# Patient Record
Sex: Male | Born: 1935 | Race: White | Hispanic: No | Marital: Married | Smoking: Former smoker
Health system: Southern US, Community
[De-identification: ages and names within clinical notes are randomized; demographics above are authoritative.]

## PROBLEM LIST (undated history)

## (undated) DIAGNOSIS — M1990 Unspecified osteoarthritis, unspecified site: Secondary | ICD-10-CM

## (undated) DIAGNOSIS — E785 Hyperlipidemia, unspecified: Secondary | ICD-10-CM

## (undated) DIAGNOSIS — I509 Heart failure, unspecified: Secondary | ICD-10-CM

## (undated) DIAGNOSIS — I739 Peripheral vascular disease, unspecified: Secondary | ICD-10-CM

## (undated) DIAGNOSIS — N400 Enlarged prostate without lower urinary tract symptoms: Secondary | ICD-10-CM

## (undated) DIAGNOSIS — Z7901 Long term (current) use of anticoagulants: Secondary | ICD-10-CM

## (undated) DIAGNOSIS — I255 Ischemic cardiomyopathy: Secondary | ICD-10-CM

## (undated) DIAGNOSIS — I469 Cardiac arrest, cause unspecified: Secondary | ICD-10-CM

## (undated) DIAGNOSIS — I10 Essential (primary) hypertension: Secondary | ICD-10-CM

## (undated) DIAGNOSIS — Z87442 Personal history of urinary calculi: Secondary | ICD-10-CM

## (undated) DIAGNOSIS — I480 Paroxysmal atrial fibrillation: Secondary | ICD-10-CM

## (undated) DIAGNOSIS — I779 Disorder of arteries and arterioles, unspecified: Secondary | ICD-10-CM

## (undated) DIAGNOSIS — E119 Type 2 diabetes mellitus without complications: Secondary | ICD-10-CM

## (undated) DIAGNOSIS — Z95810 Presence of automatic (implantable) cardiac defibrillator: Secondary | ICD-10-CM

## (undated) HISTORY — DX: Cardiac arrest, cause unspecified: I46.9

## (undated) HISTORY — DX: Hyperlipidemia, unspecified: E78.5

## (undated) HISTORY — DX: Ischemic cardiomyopathy: I25.5

## (undated) HISTORY — DX: Type 2 diabetes mellitus without complications: E11.9

## (undated) HISTORY — PX: FETAL SURGERY FOR CONGENITAL HERNIA: SHX1618

## (undated) HISTORY — DX: Personal history of urinary calculi: Z87.442

## (undated) HISTORY — DX: Paroxysmal atrial fibrillation: I48.0

## (undated) HISTORY — DX: Unspecified osteoarthritis, unspecified site: M19.90

## (undated) HISTORY — DX: Benign prostatic hyperplasia without lower urinary tract symptoms: N40.0

## (undated) HISTORY — DX: Disorder of arteries and arterioles, unspecified: I77.9

## (undated) HISTORY — DX: Essential (primary) hypertension: I10

## (undated) HISTORY — DX: Long term (current) use of anticoagulants: Z79.01

## (undated) HISTORY — DX: Peripheral vascular disease, unspecified: I73.9

## (undated) HISTORY — PX: CARDIAC DEFIBRILLATOR PLACEMENT: SHX171

## (undated) HISTORY — PX: OTHER SURGICAL HISTORY: SHX169

## (undated) HISTORY — DX: Presence of automatic (implantable) cardiac defibrillator: Z95.810

---

## 1999-07-01 HISTORY — PX: CORONARY ARTERY BYPASS GRAFT: SHX141

## 2000-02-24 ENCOUNTER — Inpatient Hospital Stay (HOSPITAL_COMMUNITY): Admission: EM | Admit: 2000-02-24 | Discharge: 2000-03-07 | Payer: Self-pay | Admitting: Cardiology

## 2000-02-24 DIAGNOSIS — I469 Cardiac arrest, cause unspecified: Secondary | ICD-10-CM

## 2000-02-24 HISTORY — DX: Cardiac arrest, cause unspecified: I46.9

## 2000-02-26 ENCOUNTER — Encounter: Payer: Self-pay | Admitting: Cardiology

## 2000-02-27 ENCOUNTER — Encounter: Payer: Self-pay | Admitting: Cardiology

## 2000-02-28 ENCOUNTER — Encounter: Payer: Self-pay | Admitting: Cardiothoracic Surgery

## 2000-02-28 DIAGNOSIS — I255 Ischemic cardiomyopathy: Secondary | ICD-10-CM

## 2000-02-28 HISTORY — DX: Ischemic cardiomyopathy: I25.5

## 2000-02-29 ENCOUNTER — Encounter: Payer: Self-pay | Admitting: Thoracic Surgery (Cardiothoracic Vascular Surgery)

## 2000-02-29 ENCOUNTER — Encounter: Payer: Self-pay | Admitting: Cardiothoracic Surgery

## 2000-03-01 ENCOUNTER — Encounter: Payer: Self-pay | Admitting: Cardiothoracic Surgery

## 2000-03-02 ENCOUNTER — Encounter: Payer: Self-pay | Admitting: Cardiothoracic Surgery

## 2000-03-02 ENCOUNTER — Encounter: Payer: Self-pay | Admitting: Thoracic Surgery (Cardiothoracic Vascular Surgery)

## 2001-01-05 ENCOUNTER — Ambulatory Visit (HOSPITAL_COMMUNITY): Admission: RE | Admit: 2001-01-05 | Discharge: 2001-01-05 | Payer: Self-pay | Admitting: Gastroenterology

## 2001-01-05 ENCOUNTER — Encounter (INDEPENDENT_AMBULATORY_CARE_PROVIDER_SITE_OTHER): Payer: Self-pay | Admitting: Specialist

## 2001-09-28 ENCOUNTER — Encounter: Payer: Self-pay | Admitting: Vascular Surgery

## 2001-09-29 ENCOUNTER — Inpatient Hospital Stay (HOSPITAL_COMMUNITY): Admission: RE | Admit: 2001-09-29 | Discharge: 2001-09-30 | Payer: Self-pay | Admitting: Vascular Surgery

## 2001-09-29 ENCOUNTER — Encounter (INDEPENDENT_AMBULATORY_CARE_PROVIDER_SITE_OTHER): Payer: Self-pay | Admitting: Specialist

## 2002-10-24 ENCOUNTER — Encounter: Admission: RE | Admit: 2002-10-24 | Discharge: 2003-01-22 | Payer: Self-pay | Admitting: Internal Medicine

## 2002-12-24 ENCOUNTER — Encounter: Payer: Self-pay | Admitting: Cardiology

## 2002-12-24 ENCOUNTER — Ambulatory Visit (HOSPITAL_COMMUNITY): Admission: RE | Admit: 2002-12-24 | Discharge: 2002-12-24 | Payer: Self-pay | Admitting: Cardiology

## 2008-09-14 ENCOUNTER — Ambulatory Visit: Payer: Self-pay | Admitting: Surgery

## 2009-10-28 HISTORY — PX: TRANSURETHRAL RESECTION OF PROSTATE: SHX73

## 2009-11-20 ENCOUNTER — Ambulatory Visit (HOSPITAL_COMMUNITY): Admission: RE | Admit: 2009-11-20 | Discharge: 2009-11-21 | Payer: Self-pay | Admitting: Urology

## 2009-11-20 ENCOUNTER — Encounter (INDEPENDENT_AMBULATORY_CARE_PROVIDER_SITE_OTHER): Payer: Self-pay | Admitting: Urology

## 2010-02-20 ENCOUNTER — Ambulatory Visit (HOSPITAL_COMMUNITY): Admission: RE | Admit: 2010-02-20 | Discharge: 2010-02-20 | Payer: Self-pay | Admitting: Cardiology

## 2010-02-20 ENCOUNTER — Ambulatory Visit: Payer: Self-pay | Admitting: Cardiology

## 2010-02-20 ENCOUNTER — Encounter: Payer: Self-pay | Admitting: Internal Medicine

## 2010-02-25 ENCOUNTER — Ambulatory Visit: Payer: Self-pay | Admitting: Cardiology

## 2010-02-28 HISTORY — PX: CARDIAC CATHETERIZATION: SHX172

## 2010-03-05 ENCOUNTER — Encounter: Payer: Self-pay | Admitting: Internal Medicine

## 2010-03-05 ENCOUNTER — Ambulatory Visit: Payer: Self-pay | Admitting: Cardiology

## 2010-03-05 ENCOUNTER — Encounter: Admission: RE | Admit: 2010-03-05 | Discharge: 2010-03-05 | Payer: Self-pay | Admitting: Cardiology

## 2010-03-13 ENCOUNTER — Encounter: Payer: Self-pay | Admitting: Internal Medicine

## 2010-03-13 ENCOUNTER — Ambulatory Visit: Payer: Self-pay | Admitting: Cardiology

## 2010-03-21 ENCOUNTER — Encounter: Payer: Self-pay | Admitting: Internal Medicine

## 2010-03-21 ENCOUNTER — Ambulatory Visit (HOSPITAL_COMMUNITY): Admission: RE | Admit: 2010-03-21 | Discharge: 2010-03-22 | Payer: Self-pay | Admitting: Cardiology

## 2010-03-21 ENCOUNTER — Ambulatory Visit: Payer: Self-pay | Admitting: Cardiology

## 2010-04-02 ENCOUNTER — Ambulatory Visit: Payer: Self-pay | Admitting: Cardiology

## 2010-04-18 ENCOUNTER — Ambulatory Visit: Payer: Self-pay | Admitting: Internal Medicine

## 2010-04-18 DIAGNOSIS — R0989 Other specified symptoms and signs involving the circulatory and respiratory systems: Secondary | ICD-10-CM

## 2010-04-18 DIAGNOSIS — R0609 Other forms of dyspnea: Secondary | ICD-10-CM

## 2010-04-18 DIAGNOSIS — I4891 Unspecified atrial fibrillation: Secondary | ICD-10-CM

## 2010-04-18 DIAGNOSIS — I5022 Chronic systolic (congestive) heart failure: Secondary | ICD-10-CM

## 2010-04-19 ENCOUNTER — Encounter (INDEPENDENT_AMBULATORY_CARE_PROVIDER_SITE_OTHER): Payer: Self-pay | Admitting: *Deleted

## 2010-04-22 ENCOUNTER — Telehealth (INDEPENDENT_AMBULATORY_CARE_PROVIDER_SITE_OTHER): Payer: Self-pay | Admitting: *Deleted

## 2010-04-22 ENCOUNTER — Ambulatory Visit: Payer: Self-pay | Admitting: Internal Medicine

## 2010-04-29 ENCOUNTER — Ambulatory Visit: Payer: Self-pay | Admitting: Cardiology

## 2010-04-30 ENCOUNTER — Ambulatory Visit: Payer: Self-pay | Admitting: Cardiology

## 2010-04-30 ENCOUNTER — Inpatient Hospital Stay (HOSPITAL_COMMUNITY): Admission: AD | Admit: 2010-04-30 | Discharge: 2010-05-03 | Payer: Self-pay | Admitting: Internal Medicine

## 2010-04-30 DIAGNOSIS — Z95810 Presence of automatic (implantable) cardiac defibrillator: Secondary | ICD-10-CM

## 2010-04-30 HISTORY — DX: Presence of automatic (implantable) cardiac defibrillator: Z95.810

## 2010-05-01 ENCOUNTER — Encounter: Payer: Self-pay | Admitting: Internal Medicine

## 2010-05-02 ENCOUNTER — Telehealth: Payer: Self-pay | Admitting: Internal Medicine

## 2010-05-03 ENCOUNTER — Telehealth: Payer: Self-pay | Admitting: Internal Medicine

## 2010-05-06 ENCOUNTER — Encounter: Payer: Self-pay | Admitting: Internal Medicine

## 2010-05-08 ENCOUNTER — Inpatient Hospital Stay (HOSPITAL_COMMUNITY): Admission: RE | Admit: 2010-05-08 | Discharge: 2010-05-10 | Payer: Self-pay | Admitting: Internal Medicine

## 2010-05-08 ENCOUNTER — Ambulatory Visit: Payer: Self-pay | Admitting: Internal Medicine

## 2010-05-16 ENCOUNTER — Ambulatory Visit: Payer: Self-pay

## 2010-05-21 ENCOUNTER — Ambulatory Visit: Payer: Self-pay | Admitting: Cardiology

## 2010-05-27 ENCOUNTER — Ambulatory Visit: Payer: Self-pay | Admitting: Cardiology

## 2010-05-27 ENCOUNTER — Encounter: Payer: Self-pay | Admitting: Internal Medicine

## 2010-06-03 ENCOUNTER — Ambulatory Visit: Payer: Self-pay | Admitting: Cardiology

## 2010-06-10 ENCOUNTER — Ambulatory Visit: Payer: Self-pay | Admitting: Cardiology

## 2010-06-26 ENCOUNTER — Ambulatory Visit: Payer: Self-pay | Admitting: Cardiology

## 2010-07-02 ENCOUNTER — Inpatient Hospital Stay (HOSPITAL_COMMUNITY)
Admission: EM | Admit: 2010-07-02 | Discharge: 2010-07-07 | Payer: Self-pay | Source: Home / Self Care | Attending: Cardiology | Admitting: Cardiology

## 2010-07-03 LAB — CARDIAC PANEL(CRET KIN+CKTOT+MB+TROPI)
CK, MB: 1.1 ng/mL (ref 0.3–4.0)
CK, MB: 0.8 ng/mL (ref 0.3–4.0)
Relative Index: INVALID (ref 0.0–2.5)
Relative Index: INVALID (ref 0.0–2.5)
Total CK: 46 U/L (ref 7–232)
Total CK: 40 U/L (ref 7–232)
Troponin I: 0.03 ng/mL (ref 0.00–0.06)
Troponin I: 0.03 ng/mL (ref 0.00–0.06)

## 2010-07-03 LAB — COMPREHENSIVE METABOLIC PANEL
ALT: 20 U/L (ref 0–53)
AST: 26 U/L (ref 0–37)
Albumin: 3.4 g/dL — ABNORMAL LOW (ref 3.5–5.2)
Alkaline Phosphatase: 152 U/L — ABNORMAL HIGH (ref 39–117)
BUN: 11 mg/dL (ref 6–23)
CO2: 33 mEq/L — ABNORMAL HIGH (ref 19–32)
Calcium: 8.7 mg/dL (ref 8.4–10.5)
Chloride: 102 mEq/L (ref 96–112)
Creatinine, Ser: 0.84 mg/dL (ref 0.4–1.5)
GFR calc Af Amer: >60 mL/min (ref >60)
GFR calc non Af Amer: >60 mL/min (ref >60)
Glucose, Bld: 97 mg/dL (ref 70–99)
Potassium: 3.6 mEq/L (ref 3.5–5.1)
Sodium: 140 mEq/L (ref 135–145)
Total Bilirubin: 1.2 mg/dL (ref 0.3–1.2)
Total Protein: 6.8 g/dL (ref 6.0–8.3)

## 2010-07-03 LAB — GLUCOSE, CAPILLARY
Glucose-Capillary: 115 mg/dL — ABNORMAL HIGH (ref 70–99)
Glucose-Capillary: 161 mg/dL — ABNORMAL HIGH (ref 70–99)
Glucose-Capillary: 120 mg/dL — ABNORMAL HIGH (ref 70–99)

## 2010-07-03 LAB — PROTIME-INR
INR: 1.77 — ABNORMAL HIGH (ref 0.00–1.49)
Prothrombin Time: 20.8 seconds — ABNORMAL HIGH (ref 11.6–15.2)

## 2010-07-03 LAB — MAGNESIUM: Magnesium: 2.6 mg/dL — ABNORMAL HIGH (ref 1.5–2.5)

## 2010-07-04 LAB — PROTIME-INR
INR: 2.14 — ABNORMAL HIGH (ref 0.00–1.49)
Prothrombin Time: 24.1 seconds — ABNORMAL HIGH (ref 11.6–15.2)

## 2010-07-04 LAB — GLUCOSE, CAPILLARY: Glucose-Capillary: 150 mg/dL — ABNORMAL HIGH (ref 70–99)

## 2010-07-05 LAB — BASIC METABOLIC PANEL
BUN: 13 mg/dL (ref 6–23)
CO2: 29 mEq/L (ref 19–32)
Calcium: 9.2 mg/dL (ref 8.4–10.5)
Chloride: 101 mEq/L (ref 96–112)
Creatinine, Ser: 0.94 mg/dL (ref 0.4–1.5)
GFR calc Af Amer: >60 mL/min (ref >60)
GFR calc non Af Amer: >60 mL/min (ref >60)
Glucose, Bld: 100 mg/dL — ABNORMAL HIGH (ref 70–99)
Potassium: 4.6 mEq/L (ref 3.5–5.1)
Sodium: 138 mEq/L (ref 135–145)

## 2010-07-05 LAB — CBC
HCT: 39.7 % (ref 39.0–52.0)
Hemoglobin: 13.1 g/dL (ref 13.0–17.0)
MCH: 30 pg (ref 26.0–34.0)
MCHC: 33 g/dL (ref 30.0–36.0)
MCV: 90.8 fL (ref 78.0–100.0)
Platelets: 183 10*3/uL (ref 150–400)
RBC: 4.37 MIL/uL (ref 4.22–5.81)
RDW: 15.1 % (ref 11.5–15.5)
WBC: 8.4 10*3/uL (ref 4.0–10.5)

## 2010-07-05 LAB — PROTIME-INR
INR: 2.02 — ABNORMAL HIGH (ref 0.00–1.49)
Prothrombin Time: 23 seconds — ABNORMAL HIGH (ref 11.6–15.2)

## 2010-07-11 ENCOUNTER — Encounter: Payer: Self-pay | Admitting: Internal Medicine

## 2010-07-15 LAB — PROTIME-INR
INR: 1.73 — ABNORMAL HIGH (ref 0.00–1.49)
INR: 1.83 — ABNORMAL HIGH (ref 0.00–1.49)
Prothrombin Time: 20.4 seconds — ABNORMAL HIGH (ref 11.6–15.2)
Prothrombin Time: 21.3 seconds — ABNORMAL HIGH (ref 11.6–15.2)

## 2010-07-15 LAB — GLUCOSE, CAPILLARY
Glucose-Capillary: 202 mg/dL — ABNORMAL HIGH (ref 70–99)
Glucose-Capillary: 210 mg/dL — ABNORMAL HIGH (ref 70–99)

## 2010-07-18 ENCOUNTER — Ambulatory Visit: Admission: RE | Admit: 2010-07-18 | Discharge: 2010-07-18 | Payer: Self-pay | Source: Home / Self Care

## 2010-07-18 ENCOUNTER — Encounter: Payer: Self-pay | Admitting: Internal Medicine

## 2010-07-18 NOTE — Op Note (Signed)
Ian Williams, Ian Williams              ACCOUNT NO.:  [id number]  MEDICAL RECORD NO.:  [id number]          PATIENT TYPE:  INP  LOCATION:  2039                         FACILITY:  [facility]  PHYSICIAN:  Duke Salvia, MD, FACCDATE OF BIRTH:  1931-02-25  DATE OF PROCEDURE: DATE OF DISCHARGE:                              OPERATIVE REPORT   PREOPERATIVE DIAGNOSES:  Status post ICD with post implant ventricular tachycardia - polymorphic; reversion to sinus rhythm thought to have had permanent atrial fibrillation.  POSTOPERATIVE DIAGNOSES:  Status post ICD with post implant ventricular tachycardia - polymorphic; reversion to sinus rhythm thought to have had permanent atrial fibrillation.  PROCEDURE:  Repositioning of the defibrillator lead, insertion of the right atrial lead.  Contrast venogram demonstrated patency of the vein prior to procedure initiation and pocket revision to allow for housing of the defibrillator, the larger defibrillator with the application of Aegis pouch.  Following obtaining informed consent, the patient was brought to the electrophysiology laboratory and placed on the fluoroscopic table in supine position.  After routine prep and drape, lidocaine was infiltrated overlying the previous incision and carried down to layer of device pocket using sharp dissection electrocautery.  The pocket was opened and device was explanted.  The right ventricular defibrillator lead was freed up.  The screw was retracted and the lead was torqued counterclockwise with easy separation from the myocardium.  The patient's lead was then repositioned on a somewhat distal portion of the septum instead of the apex.  In this location, the bipolar R-wave was 15.6 with a pace impedance of 602 with threshold of 0.7 and 0.5, the current of 5 volts with 0.8 MA.  There is no diaphragmatic pacing at 10 volts and the current of injury was brisk.  We then gained access to the extrathoracic left  subclavian vein and placed an 7-French sheath which was then passed through a Medtronic 5076, 52-cm lead, serial number [id number].  With great difficulty and multiple manipulations, this lead was finally secured to the floor of the right atrial appendage where bipolar P-wave was 3.3 with a pace impedance of 712 with threshold of 0.9 and 0.5, current of 5 volts at 1.2 MA.  There is no diaphragmatic pacing at 10 volts and the current of injury was brisk.  These leads were then secured to the prepectoral fascia.  The device previously explanted was removed.  A new Medtronic [id number], serial [id number].  A CRT device was implanted with the LV port plugged.  Through the device, bipolar P-wave was 3 with pace impedance of 437, threshold of  0.75 and 0.4.  The R-wave was 16.8 with pacing of 437, threshold of 0.75 and 0.4.  High-voltage impedances were 43/46.  Defibrillation threshold testing was not undertaken.  The pocket was copiously irrigated with antibiotic containing saline solution. Hemostasis was assured.  The leads and pulse generator were placed in the pocket and placed in Aegis pouch.  Surgicel was used at the cephalad end of the pocket which had to be extended because of the larger head.  The wound was then closed in 3 layers in normal fashion.  A benzoin and  Steri-Strip dressing was applied.  Needle counts, sponge counts, and instrument counts were correct at the end of procedure according to staff.  The patient tolerated the procedure without apparent complication.     Duke Salvia, MD, [facility]     SCK/MEDQ  D:  07/05/2010  T:  07/06/2010  Job:  [id number]  Electronically Signed by Sherryl Manges MD [facility] on 07/18/2010 01:40:38 PM

## 2010-07-19 ENCOUNTER — Ambulatory Visit: Payer: Self-pay | Admitting: Cardiology

## 2010-07-24 ENCOUNTER — Ambulatory Visit: Payer: Self-pay | Admitting: Cardiology

## 2010-07-25 NOTE — Discharge Summary (Addendum)
Ian Williams, Ian Williams              ACCOUNT NO.:  [id number]  MEDICAL RECORD NO.:  [id number]          PATIENT TYPE:  INP  LOCATION:  2039                         FACILITY:  [facility]  PHYSICIAN:  Rollene Rotunda, MD, FACCDATE OF BIRTH:  1930/07/17  DATE OF ADMISSION:  07/02/2010 DATE OF DISCHARGE:  07/07/2010                              DISCHARGE SUMMARY   PRIMARY CARDIOLOGIST:  Colleen Can. Deborah Chalk, MD  ELECTROPHYSIOLOGIST:  Duke Salvia, MD, [facility]  PRIMARY CARE PHYSICIAN:  Redge Gainer. Perini, MD  DISCHARGE DIAGNOSES: 1. Status post ICD with post-implant ventricular tachycardia,     polymorphic.     a.     Status post repositioning of the defibrillator lead,      insertion of the right atrial lead, pocket revision on July 05, 2010. 2. Atrial fibrillation, reversion to sinus rhythm, although thought to     have permanent atrial fibrillation. 3. Ischemic cardiomyopathy, chronic systolic congestive heart failure,     ejection fraction 20% to 25%. 4. Permanent atrial fibrillation, reversion to sinus rhythm.  SECONDARY DIAGNOSES: 1. Coronary artery disease status post aortocoronary bypass surgery in     2001, left internal mammary artery to diagonal, saphenous vein     graft to obtuse marginal 1 and obtuse marginal 2, saphenous vein     graft to posterior descending artery. 2. Diabetes. 3. Hypertension. 4. Peripheral vascular disease. 5. Hyperlipidemia. 6. Gout. 7. Hypothyroidism.  DRUG ALLERGIES: 1. PENICILLIN causing rash. 2. SULFA. 3. ASPIRIN causing hives.  PROCEDURES PERFORMED DURING HOSPITALIZATION: 1. Repositioning of defibrillator lead, insertion of right atrial     lead.  Contrast venogram demonstrated patency of the vein prior to     procedure initiation and pocket revision to rule out for housing of     defibrillator. 2. Chest x-ray postprocedure showing no pneumothorax or other apparent     complications.  There is a left subclavian AICD revision noted.    a.     CT angiography of the next demonstrating ulcerated soft      plaque and calcified plaque in the right ICA origin resulting in      stenosis up to 60% with respect to distal vessel.  Left carotid      endarterectomy with cervical carotid stenosis.  Bilateral heavily      calcified ICA atherosclerosis, which results in hemodynamically      significant stenosis in the left ICA anterior genu.  Right      vertebral artery origin and proximal left subclavian artery      atherosclerotic 50% with respect to distal vessel.  Extensive      pulmonary emphysema mass-like opacity in the right lung apex      incompensating 11 x 29 x 25 mm.  Differential considerations      include confluent scarring versus neoplasm.  Echo and followup CT      in 3 months versus PET/CT. 3. CT of head without contrast demonstrating no evidence of acute     intracranial abnormalities.  HISTORY OF PRESENT ILLNESS:  This is a 77 year old gentleman with history of coronary artery  disease and ICD insertion for secondary prevention.  The patient presented to the emergency department after a syncopal episode while eating breakfast.  This was witnessed by his wife.  The patient fell off his chair and wife noted some jerking.  The patient's loss of consciousness quickly resolved.  After his wife insistence, he presented for further evaluation.  The patient's device was interrogated in the emergency department and showed multiple episodes of ventricular tachycardia and ventricular fibrillation treated with appropriate therapy.  The patient's EKG showed atrial fibrillation with ventricular pacing at a rate of 105 beats per minute.  The patient was admitted for further evaluation.  HOSPITAL COURSE:  With the patient having multiple episodes of polymorphic ventricular tachycardia and several shocks over the last 3-4 days, his device noted that events occur in lower rate of his device if pacing.  Of note, the patient did  have polymorphic ventricular tachycardia noted at the time of his ICD insertion.  Etiology of his polymorphic ventricular tachycardia was felt not to be ischemic.  The patient did rule out for myocardial infarction.  After interrogation, his baseline rate on his ICD was increased from 50-70 beats per minute. Dr. Graciela Husbands had evaluated the patient and noted that device seems to be the issue.  There are reports wherein lead extraction has been used to correct issue.  The patient was monitored and it was felt that the patient's ICD discharge was from electrode lead arrhythmia as there was no evidence of mechanical for arrhythmia.  Therefore, the patient was taken for device revision.  Risks, benefits, and indications were discussed with the patient and he voiced understanding and agreed to proceed.  Dr. Graciela Husbands brought the patient to the EP Lab where informed consent was obtained.  Dr. Graciela Husbands completed successful repositioning of the defibrillator lead, insertion of the right atrial lead.  There was also a contrast venogram demonstrating patency of the vein prior to procedure initiation and pocket revision to allow for housing of the defibrillator as this is a larger defibrillator.  A new Medtronic device was implanted.  The patient tolerated this procedure well.  On the day after separation, the patient had some neck pain.  This relieved prior to discharge.  The patient was noted to have no further arrhythmias on telemetry.  The patient's rhythm remained in sinus rhythm, although the patient was felt to have permanent atrial fibrillation that was reversion to a sinus rhythm and is now noted to have paroxysmal atrial fibrillation.  On day of discharge, Dr. Antoine Poche evaluated the patient and noted him stable for home.  His ICD site did show slight swelling.  There was a discussion with the patient to watch his site closely overnight.  He will be evaluated in the Device Clinic in 24 hours for wound  check.  At this time, the patient will also get an INR check as he was resumed on his Coumadin.  Amiodarone was added during admission to support maintaining sinus rhythm and the patient will be continued on this at discharge.  The patient will have close followup within 10 days with Dr. Deborah Chalk for reevaluation.  DISCHARGE LABS:  INR 1.83.  VITAL SIGNS:  Stable.  DISCHARGE MEDICATIONS: 1. Amiodarone 200 mg 2 tablets every 8 hours. 2. Allopurinol 300 mg daily. 3. Amlodipine 10 mg daily. 4. Clonidine 0.3 mg daily. 5. Coumadin 5 mg 1 to 1-1/2 tablets daily, 1 tablet Wednesdays,     Fridays, and Sundays; all other days 1-1/2 tablets. 6. Digoxin 0.125  mg daily. 7. Fish oil 1 g 1 capsule twice daily. 8. Furosemide 40 mg twice daily. 9. Levothyroxine 75 mcg daily. 10.Metoprolol tartrate 100 mg twice daily. 11.Multivitamin daily. 12.Niacin 500 mg daily. 13.Ocuvite 1 tablet twice daily. 14.Pepcid 20 mg daily. 15.Potassium chloride daily. 16.Ramipril 5 mg twice daily. 17.Tylenol oral solution 1-2 tablets daily as needed for pain. 18.Zetia 10 mg daily.  DISCHARGE PLAN AND INSTRUCTIONS: 1. The patient will follow up in the Device Clinic at [facility]     Cardiology on Monday; July 08, 2010.  At that time, the patient     will have INR drawn. 2. The patient will follow up with Dr. Deborah Chalk within 10 days, the     office will call and schedule this appointment. 3. The patient is to increase activity slowly.  No driving for 1 week. 4. The patient to see supplemental device discharge instructions for     wound care or mobility. 5. The patient is to continue low-sodium heart-healthy diet. 6. The patient is to call our office for any difficulties. 7. The patient will follow up with Dr. Graciela Husbands in 3 months, the office     will call to schedule this appointment.  Duration of discharge is greater than 30 minutes with physician and physician extender time.     Leonette Monarch,  PA-C   ______________________________ Rollene Rotunda, MD, [facility]    NB/MEDQ  D:  07/07/2010  T:  07/07/2010  Job:  [id number]  cc:   Colleen Can. Deborah Chalk, M.D. Duke Salvia, MD, [facility] Perini, M.D.  Electronically Signed by Alen Blew P.A. on 07/11/2010 03:33:20 PM Electronically Signed by Rollene Rotunda MD [facility] on 07/25/2010 12:23:32 PM

## 2010-07-30 NOTE — Cardiovascular Report (Signed)
Summary: Pre Op Orders   Pre Op Orders   Imported By: Roderic Ovens 05/01/2010 11:46:47  _____________________________________________________________________  External Attachment:    Type:   Image     Comment:   External Document

## 2010-07-30 NOTE — Consult Note (Signed)
Summary: [facility]  [facility]   Imported By: Roderic Ovens 05/13/2010 09:28:36  _____________________________________________________________________  External Attachment:    Type:   Image     Comment:   External Document

## 2010-07-30 NOTE — Progress Notes (Signed)
Summary: Nightwatch monitor  Phone Note Outgoing Call Call back at [facility] Phone [phone]   Call placed by: Stanton Kidney, EMT-P,  April 22, 2010 2:55 PM Call placed to: Patient Action Taken: Phone Call Completed Summary of Call: Pt confirmed info for enrollment of Nightwatch monitor. Stanton Kidney, EMT-P  April 22, 2010 2:55 PM

## 2010-07-30 NOTE — Progress Notes (Signed)
Summary: fyi :sleep test not done  Phone Note From Other Clinic   Caller: [facility] Summary of Call: calling to let us know home sleep test not done on 10-27 as scheduled, pt in hospital Initial call taken by: Glynda Jaeger,  May 02, 2010 9:39 AM  Follow-up for Phone Call        pt admited at [facility]. BNP 2992, TSH 13, Bili 1.8, ALK phos 158. ABD U/S pned, CE - x3, Tachy. Plan is for diuresis. Notes state that Dr. Ladona Ridgel may do ICD implant on Friday 11/5 instead of Dr. Graciela Husbands on 11/9, Follow-up by: Claris Gladden RN,  May 02, 2010 9:56 AM

## 2010-07-30 NOTE — Letter (Signed)
Summary: [facility]   Imported By: Marylou Mccoy 04/29/2010 12:56:15  _____________________________________________________________________  External Attachment:    Type:   Image     Comment:   External Document

## 2010-07-30 NOTE — Procedures (Signed)
Summary: [facility]./ appt is 12:15/ gd   Current Medications (verified): 1)  Norvasc 10 Mg Tabs (Amlodipine Besylate) .... Once Daily 2)  Zetia 10 Mg Tabs (Ezetimibe) .Marland Kitchen.. 1-2 Once Daily 3)  Catapres 0.3 Mg Tabs (Clonidine Hcl) .... Once Daily 4)  Ramipril 5 Mg Caps (Ramipril) .Marland Kitchen.. 1 Cap, Two Times A Day 5)  Zyloprim 300 Mg Tabs (Allopurinol) .... Once Daily 6)  Lopressor 100 Mg Tabs (Metoprolol Tartrate) .... Once Daily 7)  Niaspan 500 Mg Cr-Tabs (Niacin (Antihyperlipidemic)) .... Once Daily 8)  K-Lor 20 Meq Pack (Potassium Chloride) .... Take 2 Tablet By Mouth Once Daily 9)  Coumadin 5 Mg Tabs (Warfarin Sodium) .... As Directed 10)  Furosemide 40 Mg Tabs (Furosemide) .... Take 2 Tablet By Mouth Two Times A Day 11)  Fish Oil 1000 Mg Caps (Omega-3 Fatty Acids) .... Two Times A Day 12)  Tylenol 325 Mg Tabs (Acetaminophen) .... As Needed 13)  Ocuvite Preservision  Tabs (Multiple Vitamins-Minerals) .... Two Times A Day 14)  [employer] Spectravite Performance  Tabs (Multiple Vitamins-Minerals) .... Once Daily 15)  Digoxin 0.125 Mg Tabs (Digoxin) .... Take 1 Tablet By Mouth Once Daily 16)  Levothroid 75 Mcg Tabs (Levothyroxine Sodium) .... Take 1 Tablet By Mouth Once Daily 17)  Pepcid 20 Mg Tabs (Famotidine) .... Take 1 Tablet By Mouth Once Daily  Allergies (verified): 1)  ! Pcn 2)  ! Sulfa   ICD Specifications Following MD:  Sherryl Manges, MD     ICD Vendor:  Medtronic     ICD Model Number:  [id number]     ICD Serial Number:  [id number] ICD DOI:  05/08/2010     ICD Implanting MD:  Sherryl Manges, MD  Lead 1:    Location: RV     DOI: 05/08/2010     Model #: [id number]     Serial #: [id number]     Status: active  ICD Follow Up Battery Voltage:  3.21 V     Charge Time:  8.7 seconds     Underlying rhythm:  SR   ICD Device Measurements Right Ventricle:  Amplitude: 8.5 mV, Impedance: 437 ohms, Threshold: 1.00 V at 0.40 msec Shock Impedance: 42/49 ohms   Episodes MS Episodes:  0     Shock:  0     ATP:  0      Nonsustained:  0     Atrial Therapies:  0 Ventricular Pacing:  25.9%  Brady Parameters Mode VVI     Lower Rate Limit:  50      Tachy Zones VF:  200     VT:  200-231     VT1:  171-200     Next Cardiology Appt Due:  08/21/2010 Tech Comments:  WOUND CHECK--STERI STRIPS REMOVED.  NO REDNESS OR HEAT TO SITE.  SOME SWELLING AT SITE.  PER PT HAD COMPRESSION BANDAGE PUT ON AT HOSP.  PT STARTED BACK COUMADIN--FOLLOWED BY [facility] [facility]. PT TO HAVE CHECK ON 11-22. PT TO CALL IF NOTICES ANY CHANGES.  PT WILL HAVE SITE CHECKED ON 11-22.  NORMAL DEVICE FUNCTION.  CHANGED MAX LEAD IMPEDANCES FOR LIA. ROV 08-21-10 @ 915 W/SK. Vella Kohler  May 16, 2010 12:51 PM   Patient Instructions: 1)  Appointment with Dr Graciela Husbands scheduled for 08-21-10 @ 915am.

## 2010-07-30 NOTE — Cardiovascular Report (Signed)
Summary: [facility]  [facility]   Imported By: Marylou Mccoy 04/29/2010 12:14:21  _____________________________________________________________________  External Attachment:    Type:   Image     Comment:   External Document

## 2010-07-30 NOTE — Progress Notes (Addendum)
Summary: DOES PT HAVE T GET LABS DONE  Phone Note Call from Patient Call back at Home Phone [phone]   Caller: Spouse/ELIZABETH Reason for Call: Talk to Nurse Complaint: Chest Pain Summary of Call: PT WIFE ELIZABETH WOULD LIKE TO KNOW IF HE NEED TO RESCHEDULE HIS APPT FOR LAB. PT WAS IN THE HOSPITAL 10.2.11 AND DC 10.4.11.Marland KitchenLast PT :    IS SCHEDULE FOR A DEVICE ON 10.9.11.  Initial call taken by: Roe Coombs,  May 03, 2010 3:47 PM  Follow-up for Phone Call        pt's wife calling back from friday's message,didn't hear anything and would like to know asap re rs the lab work prior to pt's surgery-pls call -ok to leave msg on [phone] Glynda Jaeger  May 06, 2010 8:53 AM  Additional Follow-up for Phone Call Additional follow up Details #1::        s/w Mrs. Moravek and told her no need to come in for labs. printed off hospital lab.s  Additional Follow-up by: Claris Gladden RN,  May 06, 2010 9:19 AM     Appended Document: DOES PT HAVE T GET LABS DONE liver tests are abnormall  this may be related to heart failure or something  lets plan to redraw these when he shows up on wed t thanks  steve

## 2010-07-30 NOTE — Letter (Signed)
Summary: [facility]  [facility]   Imported By: Marylou Mccoy 04/29/2010 12:53:21  _____________________________________________________________________  External Attachment:    Type:   Image     Comment:   External Document

## 2010-07-30 NOTE — Assessment & Plan Note (Signed)
Summary: nep. discuss icd placement pt has medicare. gd   Visit Type:  new pt Referring Provider:  Dr. Roger Shelter  CC:  shortness of breath-Occ and .  History of Present Illness: Ian Williams is seen today at the request of Dr. Deborah Chalk for consideration of ICD implantation.  He had no other went to bypass surgery in 2001 after presenting with a heart attack and cardiac arrest. He did well. He has had progressive dysfunction however on his left ventricle with ejection fraction 2009 measured at 40% and more recently 20-25%. Because of the change he underwent catheterization at the end of September demonstrating patent grafts including the OM1 OM 2 and the RCA and PDA as well as a patent IMA. There is diffuse native disease ejection fraction was 15-20% no significant MR was noted.  The patient has chronic atrial fibrillation. He has some problems with tachycardia palpitations associated with lightheadedness. These episodes are typically quite brief lasting just seconds.  He has chronic dyspnea on exertion. Interestingly he underwent prostate surgery in May; in the wake of that his chronic peripheral edema resolved.    Current Medications (verified): 1)  Lipitor 20 Mg Tabs (Atorvastatin Calcium) .... Once Daily 2)  Norvasc 10 Mg Tabs (Amlodipine Besylate) .... Once Daily 3)  Zetia 10 Mg Tabs (Ezetimibe) .Marland Kitchen.. 1-2 Once Daily 4)  Catapres 0.3 Mg Tabs (Clonidine Hcl) .... Once Daily 5)  Ramipril 5 Mg Caps (Ramipril) .Marland Kitchen.. 1 Cap, Two Times A Day 6)  Zyloprim 300 Mg Tabs (Allopurinol) .... Once Daily 7)  Lopressor 100 Mg Tabs (Metoprolol Tartrate) .... Once Daily 8)  Niaspan 500 Mg Cr-Tabs (Niacin (Antihyperlipidemic)) .... Once Daily 9)  K-Lor 20 Meq Pack (Potassium Chloride) .Marland Kitchen.. 1 1/2, Once Daily 10)  Coumadin 5 Mg Tabs (Warfarin Sodium) .... As Directed 11)  Furosemide 40 Mg Tabs (Furosemide) .... Two Times A Day 12)  Fish Oil 1000 Mg Caps (Omega-3 Fatty Acids) .... Two Times A Day 13)   Tylenol 500 Mg/93ml Liqd (Acetaminophen) .... As Needed 14)  Ocuvite Preservision  Tabs (Multiple Vitamins-Minerals) .... Two Times A Day 15)  [facility]vs Spectravite Performance  Tabs (Multiple Vitamins-Minerals) .... Once Daily  Allergies (verified): 1)  ! Pcn 2)  ! Sulfa  Past History:  Past Medical History: Last updated: 2010/04/27 Ischemic heart disease with previous coronary artery bypass grafting in 2001 per Dr. Sheliah Plane Acute LV systolic dysfunction, appears compensated.  EF 20 to 25 %-2011 Chronic atrial fibrillation Peripheral vascular disease Hypertension Hyperlipidemia History of kidney stones Gout Type 2 diabetes Degenerative joint disease BPH  Past Surgical History: Last updated: 04/27/2010 Previous left carotid endarterectomy Hernia surgery Status post TURP in May 2011 Previous left eye cataract surgery  Family History: Last updated: 2010-04-27 His father died of a stroke.   His mother died of cancer.   One son does have a defibrillator in place from a  cardiomyopathy  Social History: Last updated: April 27, 2010 He is married and he has 10 children.   He previously drank 2 drinks of alcohol and smoked a pack of cigarettes per day but has stopped since 2001.  He does have 3-4 cups of coffee a day.   He lives at home with his wife.   Review of Systems       full review of systems was negative apart from a history of present illness and past medical history.  Vital Signs:  Patient profile:   76 year old male Height:      70  inches Weight:      158 pounds BMI:     22.75 Pulse rate:   79 / minute BP sitting:   88 / 52  (left arm) Cuff size:   regular  Vitals Entered By: Caralee Ates CMA (April 18, 2010 11:02 AM)  Physical Exam  General:  The patient was alert and oriented order Caucasian male appearing his stated age in no acute distress.HEENT normal Neck veins were flat, carotids were brisk. Lungs were clear. Heart sounds were irregular without  murmurs or gallops. Abdomen was soft with active bowel sounds. There is no clubbing cyanosis or edema.neurological exam is grossly normal skin was warm and dry affect engaging    EKG  Procedure date:  04/18/2010  Findings:      atrial fibrillation at 79 Intervals-/0.10/445 Axis LX  inferolateral T wave inversions  Impression & Recommendations:  Problem # 1:  CARDIOMYOPATHY, ISCHEMIC (ICD-414.8)  the patient has significant ischemic cardiac myopathy with permanent depression of left ventricular systolic function despite maximal medical therapy. He is a totally considered for ICD especially given his concomitant heart failure. We did discuss the mitigating aspect of age on outcome is anticipated with ICD implantation; he desires to proceed. We reviewed the potential benefits as well as the risks of device implantation including but not limited to death perforation ; inappropriate shocks as well as infection. He and his wife understand these risks and are willing to proceed.  They further understand that there is no anticipated improvement in functional status.  Orders: Holter (Holter)  Problem # 2:  SNORING (ICD-786.09) there is increasing data related to cardiomyopathy associated with sleep apnea. With his snoring and daytime somnolence we will pursue a sleep study. His updated medication list for this problem includes:    Norvasc 10 Mg Tabs (Amlodipine besylate) ..... Once daily    Ramipril 5 Mg Caps (Ramipril) .Marland Kitchen... 1 cap, two times a day    Lopressor 100 Mg Tabs (Metoprolol tartrate) ..... Once daily    Furosemide 40 Mg Tabs (Furosemide) .Marland Kitchen..Marland Kitchen Two times a day  Problem # 3:  SYSTOLIC HEART FAILURE, CHRONIC (ICD-428.22)  Stable continue current medications His updated medication list for this problem includes:    Norvasc 10 Mg Tabs (Amlodipine besylate) ..... Once daily    Ramipril 5 Mg Caps (Ramipril) .Marland Kitchen... 1 cap, two times a day    Lopressor 100 Mg Tabs (Metoprolol tartrate)  ..... Once daily    Coumadin 5 Mg Tabs (Warfarin sodium) .Marland Kitchen... As directed    Furosemide 40 Mg Tabs (Furosemide) .Marland Kitchen..Marland Kitchen Two times a day  Orders: Holter (Holter)  Problem # 4:  ATRIAL FIBRILLATION (ICD-427.31)  atrial fibrillation is permanent. He appears to be well rate controlled. We will undertake his ICD implantation on Coumadin His updated medication list for this problem includes:    Lopressor 100 Mg Tabs (Metoprolol tartrate) ..... Once daily    Coumadin 5 Mg Tabs (Warfarin sodium) .Marland Kitchen... As directed  Orders: Holter (Holter)  Patient Instructions: 1)  Your physician recommends that you continue on your current medications as directed. Please refer to the Current Medication list given to you today. 2)  Your physician has recommended that you wear a holter monitor-Nightwatch.  Holter monitors are medical devices that record the heart's electrical activity. Doctors most often use these monitors to diagnose arrhythmias. Arrhythmias are problems with the speed or rhythm of the heartbeat. The monitor is a small, portable device. You can wear one while you do your normal daily  activities. This is usually used to diagnose what is causing palpitations/syncope (passing out).

## 2010-07-30 NOTE — Letter (Signed)
Summary: Physician's Orders  Physician's Orders   Imported By: Marylou Mccoy 05/22/2010 08:03:38  _____________________________________________________________________  External Attachment:    Type:   Image     Comment:   External Document

## 2010-07-30 NOTE — Letter (Signed)
Summary: Implantable Device Instructions  Architectural technologist, Main Office  [address]   [place], [place] [address]   Phone: [phone]  Fax: [phone]      Implantable Device Instructions  You are scheduled for:   ___x__ Implantable Cardioverter Defibrillator   on May 08, 2010 at 8:45 am with Dr. Graciela Husbands.  1.  Please arrive at the Short Stay Center at [facility] at 6:45 am on the day of your procedure.  2.  Do not eat or drink after midnight the night before your procedure.  3.  Complete lab work on May 01, 2010 at 10:00 am.  The lab at [address] is open from 8:30 AM to 1:30 PM and from 2:30 PM to 5:00 PM.  The lab at [facility] is open from 7:30 AM to 5:30 PM.  You do not have to be fasting.  4.  Do NOT take Furosemide the morning of your procedure.   5.  Plan for an overnight stay.  Bring your insurance cards and a list of your medications.  6.  Wash your chest and neck with antibacterial soap (any brand) the evening before and the morning of your procedure.  Rinse well.    *If you have ANY questions after you get home, please call the office [phone]. Claris Gladden, RN  *Every attempt is made to prevent procedures from being rescheduled.  Due to the nauture of Electrophysiology, rescheduling can happen.  The physician is always aware and directs the staff when this occurs.

## 2010-07-30 NOTE — Letter (Signed)
Summary: [facility]  [facility]   Imported By: Marylou Mccoy 04/29/2010 12:45:48  _____________________________________________________________________  External Attachment:    Type:   Image     Comment:   External Document

## 2010-07-31 ENCOUNTER — Ambulatory Visit (INDEPENDENT_AMBULATORY_CARE_PROVIDER_SITE_OTHER): Payer: Medicare Other | Admitting: *Deleted

## 2010-07-31 ENCOUNTER — Other Ambulatory Visit: Payer: Self-pay

## 2010-07-31 DIAGNOSIS — I4891 Unspecified atrial fibrillation: Secondary | ICD-10-CM

## 2010-07-31 DIAGNOSIS — Z7901 Long term (current) use of anticoagulants: Secondary | ICD-10-CM

## 2010-07-31 DIAGNOSIS — Z950 Presence of cardiac pacemaker: Secondary | ICD-10-CM

## 2010-07-31 DIAGNOSIS — I2589 Other forms of chronic ischemic heart disease: Secondary | ICD-10-CM

## 2010-08-01 NOTE — Letter (Signed)
Summary: [facility] Progress Note   [facility] Progress Note   Imported By: Roderic Ovens 06/11/2010 15:36:41  _____________________________________________________________________  External Attachment:    Type:   Image     Comment:   External Document

## 2010-08-01 NOTE — Miscellaneous (Signed)
Summary: Device preload  Clinical Lists Changes  Observations: Added new observation of ICD INDICATN: VT ICM (07/11/2010 13:23) Added new observation of ICDLEADSTAT2: active (07/11/2010 13:23) Added new observation of ICDLEADSER2: [id number] (07/11/2010 13:23) Added new observation of ICDLEADMOD2: 5076  (07/11/2010 13:23) Added new observation of ICDLEADDOI2: 07/05/2010  (07/11/2010 13:23) Added new observation of ICDLEADLOC2: RA  (07/11/2010 13:23) Added new observation of ICD IMPL DTE: 07/05/2010  (07/11/2010 13:23) Added new observation of ICD SERL#: [id number]  (07/11/2010 13:23) Added new observation of ICD MODL#: [id number]  (07/11/2010 13:23)       ICD Specifications Following MD:  Sherryl Manges, MD     ICD Vendor:  Medtronic     ICD Model Number:  D314TRG     ICD Serial Number:  [id number] ICD DOI:  07/05/2010     ICD Implanting MD:  Sherryl Manges, MD  Lead 1:    Location: RV     DOI: 05/08/2010     Model #: [id number]     Serial #: [id number]     Status: active Lead 2:    Location: RA     DOI: 07/05/2010     Model #: [id number]     Serial #: [id number]     Status: active  Indications::  VT ICM   Brady Parameters Mode VVI     Lower Rate Limit:  50      Tachy Zones VF:  200     VT:  200-231     VT1:  171-200

## 2010-08-01 NOTE — Procedures (Addendum)
Summary: wch   Current Medications (verified): 1)  Norvasc 10 Mg Tabs (Amlodipine Besylate) .... Once Daily 2)  Zetia 10 Mg Tabs (Ezetimibe) .Marland Kitchen.. 1-2 Once Daily 3)  Catapres 0.3 Mg Tabs (Clonidine Hcl) .... Once Daily 4)  Ramipril 5 Mg Caps (Ramipril) .Marland Kitchen.. 1 Cap, Two Times A Day 5)  Zyloprim 300 Mg Tabs (Allopurinol) .... Once Daily 6)  Lopressor 100 Mg Tabs (Metoprolol Tartrate) .... Two Times A Day 7)  Niaspan 500 Mg Cr-Tabs (Niacin (Antihyperlipidemic)) .... Once Daily 8)  K-Lor 20 Meq Pack (Potassium Chloride) .... Take 2 Tablet By Mouth Once Daily 9)  Coumadin 5 Mg Tabs (Warfarin Sodium) .... As Directed 10)  Furosemide 40 Mg Tabs (Furosemide) .... Two Times A Day 11)  Fish Oil 1000 Mg Caps (Omega-3 Fatty Acids) .... Two Times A Day 12)  Tylenol 325 Mg Tabs (Acetaminophen) .... As Needed 13)  Ocuvite Preservision  Tabs (Multiple Vitamins-Minerals) .... Two Times A Day 14)  Cvs Spectravite Performance  Tabs (Multiple Vitamins-Minerals) .... Once Daily 15)  Digoxin 0.125 Mg Tabs (Digoxin) .... Take 1 Tablet By Mouth Once Daily 16)  Levothroid 75 Mcg Tabs (Levothyroxine Sodium) .... Take 1 Tablet By Mouth Once Daily 17)  Pepcid 20 Mg Tabs (Famotidine) .... Take 1 Tablet By Mouth Once Daily  Allergies (verified): 1)  ! Pcn 2)  ! Sulfa   ICD Specifications Following MD:  Sherryl Manges, MD     ICD Vendor:  Medtronic     ICD Model Number:  D314TRG     ICD Serial Number:  [id number] ICD DOI:  07/05/2010     ICD Implanting MD:  Sherryl Manges, MD  Lead 1:    Location: RV     DOI: 05/08/2010     Model #: 1478     Serial #: [id number]     Status: active Lead 2:    Location: RA     DOI: 07/05/2010     Model #: 6578     Serial #: [id number]     Status: active  Indications::  VT ICM   ICD Follow Up Remote Check?  No Battery Voltage:  3.22 V     Charge Time:  8.6 seconds     Underlying rhythm:  Brady @40  ICD Dependent:  No       ICD Device Measurements Atrium:  Amplitude: 2.4 mV,  Impedance: 513 ohms, Threshold: 0.5 V at 0.4 msec Right Ventricle:  Amplitude: 20 mV, Impedance: 494 ohms, Threshold: 1.5 V at 0.4 msec Shock Impedance: 44/55 ohms   Episodes MS Episodes:  0     Percent Mode Switch:  0     Coumadin:  No Shock:  0     ATP:  0     Nonsustained:  0     Atrial Pacing:  97.7%     Ventricular Pacing:  53.4%  Brady Parameters Mode DDDR     Lower Rate Limit:  70     Upper Rate Limit 120 PAV 350     Sensed AV Delay:  350  Tachy Zones VF:  200     VT:  200-231     VT1:  171-200     Next Cardiology Appt Due:  09/29/2010 Tech Comments:  Steri strips removed, no redness or edema noted.  Atrial sensitivity reprogrammed0.3mV for FFRW.  Rate response on today.  ROV 3 months with Dr. Graciela Husbands. Altha Harm, LPN  July 18, 2010 12:19 PM   Appended Document: wch any vt  since we repositined lead  Appended Document: wch PER PT NO PROBLEMS AT THIS TIME DOIING [email or web address]./[email or web address]

## 2010-08-07 ENCOUNTER — Encounter (INDEPENDENT_AMBULATORY_CARE_PROVIDER_SITE_OTHER): Payer: Medicare Other

## 2010-08-07 DIAGNOSIS — Z7901 Long term (current) use of anticoagulants: Secondary | ICD-10-CM

## 2010-08-07 DIAGNOSIS — I4891 Unspecified atrial fibrillation: Secondary | ICD-10-CM

## 2010-08-07 NOTE — Cardiovascular Report (Signed)
Summary: Office Visit   Office Visit   Imported By: Roderic Ovens 08/01/2010 10:37:23  _____________________________________________________________________  External Attachment:    Type:   Image     Comment:   External Document

## 2010-08-08 NOTE — H&P (Signed)
Ian Williams, Ian Williams              ACCOUNT NO.:  [id number]  MEDICAL RECORD NO.:  [id number]          PATIENT TYPE:  INP  LOCATION:  2311                         FACILITY:  [facility]  PHYSICIAN:  Marca Ancona, MD      DATE OF BIRTH:  01-27-1931  DATE OF ADMISSION:  07/02/2010 DATE OF DISCHARGE:                             HISTORY & PHYSICAL   PRIMARY CARE PHYSICIAN:  Mark A. Perini, MD  PRIMARY CARDIOLOGIST:  Colleen Can. Deborah Chalk, MD  ELECTROPHYSIOLOGIST:  Duke Salvia, MD, [facility]  CHIEF COMPLAINT:  Defibrillator discharges.  HISTORY OF PRESENT ILLNESS:  Ian Williams is a 77 year old male with a history of coronary artery disease and ICD insertion for secondary prevention.  He was in his usual state of health when he was wakened suddenly yesterday early in the morning with tingling down his left arm. This resolved quickly and he went back to sleep.  He had no other problems yesterday.  Today, he was in his usual state of health and while eating breakfast had a brief period of loss of consciousness.  He fell off a chair.  This was witnessed by his wife who noted some jerking.  His loss of consciousness quickly resolved.  He got back up and finished eating.  He was not having any other symptoms but at least partly of his wife's insistence, he called the office and was asked to come in.  In the hospital, his device was interrogated and it showed multiple episodes of ventricular tachycardia and some V-Fib treated with appropriate therapies.  He has had no chest pain.  He has had no palpitations.  He denies shortness of breath or increased dyspnea on exertion.  His weight is up 2 pounds in the last 4 days, but he has not felt volume overloaded.  He has had no recent illnesses or problems. Currently, he is resting comfortably.  PAST MEDICAL HISTORY: 1. Status post aortocoronary bypass surgery in 2001 after an MI with     LIMA to diagonal, SVG to OM-1 and OM-2, SVG to PDA. 2. Status  post cardiac catheterization on March 22, 2010, showing     LAD occluded with competitive flow seen, LIMA to diagonal patent     with good distal runoff, circumflex totalled with OM-1 and OM-2     totalled, RCA totalled, all vein grafts patent.  EF was 15-20%. 3. Ischemic cardiomyopathy. 4. Permanent atrial fibrillation. 5. Status post Medtronic single-chamber ICD on May 08, 2010, for     secondary prevention. 6. BPH. 7. Chronic systolic CHF. 8. Diabetes. 9. Hypertension. 10.Peripheral vascular disease. 11.Hyperlipidemia. 12.Gout. 13.Degenerative joint disease. 14.History of nephrolithiasis. 15.Mild chronic kidney disease stage III. 16.History of left arm squamous cell carcinoma. 17.History of cataracts. 18.Hypothyroidism.  SURGICAL HISTORY:  He is status post cardiac catheterizations as well as bypass surgery, left carotid endarterectomy, lithotripsy and nephrolithotomy, hernia repair x2, TURP and SCDs.  ALLERGIES:  PENICILLIN and SULFA.  CURRENT MEDICATIONS: 1. Allopurinol 300 mg daily. 2. Altace 5 mg b.i.d. 3. Amlodipine 10 mg a day. 4. AREDS softgel OTC b.i.d. 5. Clonidine 0.3 mg daily. 6. Coumadin 5 mg between 1  and 1-1/2 tablets daily. 7. Digoxin 0.125 daily. 8. Famotidine 20 mg daily. 9. Fish oil 1000 mg b.i.d. 10.Lasix 40 mg b.i.d. 11.Synthroid 75 mcg daily. 12.Lipitor 20 mg a day. 13.Lopressor 100 mg b.i.d. 14.Multivitamin daily. 15.Niaspan 500 mg nightly 16.Potassium 20 mEq b.i.d. 17.PreserVision b.i.d. 18.Tylenol Arthritis b.i.d. p.r.n. 19.Zetia 10 mg one-half tablet nightly.  SOCIAL HISTORY:  He lives in [place] with his wife and son.  He is retired from Personal assistant.  He has approximately 40-pack-year history of tobacco use, but quit at the time of his bypass surgery 10 years ago.  He denies alcohol or drug abuse.  FAMILY HISTORY:  His mother died at 27 and his father died at 27, neither one with heart disease, but he does have  1 sister with coronary artery disease.  REVIEW OF SYSTEMS:  His weight is up 2 pounds in the last 4 days but has been stable for the last couple of months.  The syncopal episode is felt secondary to an ICD discharge.  He has not had fevers, chills or other recent illnesses and has had no chest pain or lower extremity edema.  He denies orthopnea and PND.  He never had palpitations.  He denies reflux symptoms or melena.  Full 14-point review of systems is otherwise negative except as stated in the HPI.  PHYSICAL EXAMINATION:  VITAL SIGNS:  Temperature is 98.2, blood pressure 160/90, pulse 96, respiratory rate 20, O2 saturation 96% on room air. GENERAL:  He is a well-developed, well-nourished white male in no acute distress.  HEENT:  Normal for age. NECK:  There is no lymphadenopathy or thyromegaly noted.  He has bilateral carotid bruits left greater than right, JVP at 9-10 cm. CARDIOVASCULAR:  His heart is irregular in rate and rhythm with an S1 and S2 and a soft systolic murmur is noted at the left sternal border. Distal pulse is slightly decreased peripherally but intact in all 4 extremities. LUNGS:  He has a few rales in the bases but generally clear. SKIN:  No rashes or lesions are noted. ABDOMEN:  Soft and nontender with active bowel sounds. EXTREMITIES:  There is no cyanosis, clubbing or edema noted. MUSCULOSKELETAL:  There is no joint deformity or effusions and no spine or CVA tenderness. NEURO:  He is alert and oriented.  Cranial nerves II-XII grossly intact.  Chest x-ray, no acute disease.  EKG, he has atrial fibrillation with V pacing at times rate 105.  LABORATORY VALUES:  Hemoglobin 12.5, hematocrit 38, WBC 7.0, platelets 158.  Sodium 140, potassium 3.5, chloride 101, CO2 30, BUN 17, creatinine 0.91, glucose 183, GFR greater than 60, INR 1.8, PTT 45, CK- MB and troponin I negative x1.  BNP 1360.  Magnesium and dig level are pending.  IMPRESSION:  Ian Williams was seen  by Dr. Shirlee Latch, the patient evaluated and the data reviewed.  He has had multiple episodes of polymorphic ventricular tachycardia and several shocks over the last 3-4 days. Events occur when he is pacing in the lower rate of his device, which is set at 50 beats per minute.  He had pause-dependent polymorphic ventricular tachycardia noted at the time of his ICD insertion. 1. Polymorphic ventricular tachycardia.  We doubt this is ischemic in     origin as he has had no chest pain and the recent cath showed     patent grafts with no critical distal disease.  It could be related     to volume overload/systolic CHF.  a.     Cycle cardiac enzymes.     b.     Potassium 40 mEq x1 now and repeat in 6 hours.     c.     Magnesium sulfate 2 g IV x1.     d.     Increase the baseline rate on his ICD to 70 beats per      minute.  If the higher rate suppresses his arrhythmia, consider AV      node ablation and BiV pacing given his atrial fibrillation.     e.     If more VT occurs despite the increased lower rate limit,      amiodarone can be used.  A TSH and a CMET for LFTs are ordered. 2. Acute on chronic systolic congestive heart failure:  Ian Williams is     volume overloaded with elevated JVP and elevated BNP.  He will be     continued on ramipril and his beta-blocker will be decreased     slightly and changed to Toprol-XL 150 mg a day.  We will change his     Lasix to 40 mg IV b.i.d. and make sure to replete his potassium to     a level of 4.0 and magnesium to maintain a level of 2.0.  [facility] has been contacted and will see the patient tomorrow.  He     will be continued on his other home medications with sliding scale     insulin, daily weights and strict intake and output as well as     Coumadin per pharmacy.     Theodore Demark, PA-C   ______________________________ Marca Ancona, MD    RB/MEDQ  D:  07/02/2010  T:  07/03/2010  Job:  [id number]  Electronically Signed by Theodore Demark PA-C on 07/19/2010 06:53:45 AM Electronically Signed by Marca Ancona MD on 08/08/2010 08:34:11 AM

## 2010-08-21 ENCOUNTER — Encounter: Payer: Self-pay | Admitting: Internal Medicine

## 2010-08-21 ENCOUNTER — Encounter (INDEPENDENT_AMBULATORY_CARE_PROVIDER_SITE_OTHER): Payer: Medicare Other | Admitting: Internal Medicine

## 2010-08-21 ENCOUNTER — Encounter (INDEPENDENT_AMBULATORY_CARE_PROVIDER_SITE_OTHER): Payer: Medicare Other

## 2010-08-21 DIAGNOSIS — I4891 Unspecified atrial fibrillation: Secondary | ICD-10-CM

## 2010-08-21 DIAGNOSIS — I5022 Chronic systolic (congestive) heart failure: Secondary | ICD-10-CM

## 2010-08-21 DIAGNOSIS — I472 Ventricular tachycardia: Secondary | ICD-10-CM | POA: Insufficient documentation

## 2010-08-21 DIAGNOSIS — I2589 Other forms of chronic ischemic heart disease: Secondary | ICD-10-CM

## 2010-08-21 DIAGNOSIS — I428 Other cardiomyopathies: Secondary | ICD-10-CM

## 2010-08-21 DIAGNOSIS — Z7901 Long term (current) use of anticoagulants: Secondary | ICD-10-CM

## 2010-08-21 DIAGNOSIS — E039 Hypothyroidism, unspecified: Secondary | ICD-10-CM | POA: Insufficient documentation

## 2010-08-27 NOTE — Assessment & Plan Note (Signed)
Summary: PC2. /GD PER AMBER./GD/NR   Visit Type:  ICD-Med[employer] Referring Provider:  Dr. Roger Shelter  CC:  pt stated has had a headache almost everyday.  History of Present Illness: Ian Williams is seen i followup the floor ICD implanted for primary prevention in the setting of ischemic heart disease progressive left ventricular dysfunction and prior bypass.  His device implantation was complicated by ventricular tachycardia. which developed after ICD implantation and 4 which he was appropriately shocked. he then had other episodes of ventricular tachycardia and also had reverted to what was thought to be permanent atrial fibrillation to sinus rhythm. He then underwent device revision with repositioning of the right ventricular lead because of the concern for pacing lead induced arrhythmia. He also received an atrial lead at that time because of the reversion to sinus rhythm.  Was treated concurrently with amiodarone which was initially associated with nausea and lightheadedness. These have abated markedly with down titration.  He notes that his breathing is considerably better prior to CRT implantation and is also having less palpitations likely related to the restoration of sinus rhythm.  TSH last week was up mildly elevated    catheterization 2009 September demonstratedpatent grafts including the OM1 OM 2 and the RCA and PDA as well as a patent IMA. There is diffuse native disease.;   ejection fraction was 15-20% no significant MR was noted.    Current Medications (verified): 1)  Norvasc 10 Mg Tabs (Amlodipine Besylate) .... Once Daily 2)  Zetia 10 Mg Tabs (Ezetimibe) .Marland Kitchen.. 1-2 Once Daily 3)  Catapres 0.3 Mg Tabs (Clonidine Hcl) .... Once Daily 4)  Ramipril 5 Mg Caps (Ramipril) .Marland Kitchen.. 1 Cap, Two Times A Day 5)  Zyloprim 300 Mg Tabs (Allopurinol) .... Once Daily 6)  Lopressor 100 Mg Tabs (Metoprolol Tartrate) .... Two Times A Day 7)  Niaspan 500 Mg Cr-Tabs (Niacin  (Antihyperlipidemic)) .... Once Daily 8)  K-Lor 20 Meq Pack (Potassium Chloride) .... Take 1 Tab Two Times A Day 9)  Coumadin 5 Mg Tabs (Warfarin Sodium) .... As Directed 10)  Furosemide 80 Mg Tabs (Furosemide) .... Take One Tablet By Mouth Daily. 11)  Fish Oil 1000 Mg Caps (Omega-3 Fatty Acids) .... Two Times A Day 12)  Tylenol 325 Mg Tabs (Acetaminophen) .... As Needed 13)  Ocuvite Preservision  Tabs (Multiple Vitamins-Minerals) .... Two Times A Day 14)  [facility] Spectravite Performance  Tabs (Multiple Vitamins-Minerals) .... Once Daily 15)  Digoxin 0.125 Mg Tabs (Digoxin) .... Take 1 Tablet By Mouth Once Daily 16)  Levothroid 75 Mcg Tabs (Levothyroxine Sodium) .... Take 1 Tablet By Mouth Once Daily 17)  Pepcid 20 Mg Tabs (Famotidine) .... Take 1 Tablet By Mouth Once Daily 18)  Amiodarone Hcl 200 Mg Tabs (Amiodarone Hcl) .... Take One Every 12 Hours  Allergies (verified): 1)  ! Pcn 2)  ! Sulfa  Past History:  Past Medical History: Last updated: 04/17/2010 Ischemic heart disease with previous coronary artery bypass grafting in 2001 per Dr. Sheliah Plane Acute LV systolic dysfunction, appears compensated.  EF 20 to 25 %-2011 Chronic atrial fibrillation Peripheral vascular disease Hypertension Hyperlipidemia History of kidney stones Gout Type 2 diabetes Degenerative joint disease BPH  Past Surgical History: Last updated: 25-Aug-2010 Previous left carotid endarterectomy Hernia surgery Status post TURP in May 2011 Previous left eye cataract surgery Cardiac Catheterization  Family History: Last updated: 08/25/2010 His father died of a stroke.   His mother died of cancer.   One son does have a defibrillator in  place from a  cardiomyopathy  Social History: Last updated: 08/20/2010 He is married and he has 10 children.   He previously drank 2 drinks of alcohol and smoked a pack of cigarettes per day but has stopped since 2001.  He does have 3-4 cups of coffee a day.   He  lives at home with his wife.   Vital Signs:  Patient profile:   77 year old male Height:      70 inches Weight:      146.75 pounds BMI:     21.13 Pulse rate:   76 / minute BP sitting:   142 / 82  (left arm) Cuff size:   regular  Vitals Entered By: Caralee Ates CMA (August 21, 2010 9:29 AM)  Physical Exam  General:  The patient was alert and oriented in no acute distress. HEENT Normal.  Neck veins were flat, carotids were brisk.  Lungs were clear.  Heart sounds were regular without murmurs or gallops.  Abdomen was soft with active bowel sounds. There is no clubbing cyanosis or edema. Skin Warm and dry     ICD Specifications Following MD:  Sherryl Manges, MD     ICD Vendor:  Medtronic     ICD Model Number:  [id number]     ICD Serial Number:  [id number] ICD DOI:  07/05/2010     ICD Implanting MD:  Sherryl Manges, MD  Lead 1:    Location: RV     DOI: 05/08/2010     Model #: [id number]     Serial #: [id number]     Status: active Lead 2:    Location: [facility]     DOI: 07/05/2010     Model #: [id number]     Serial #: [id number]     Status: active  Indications::  VT ICM   ICD Follow Up ICD Dependent:  No      Episodes Coumadin:  No  Brady Parameters Mode DDDR     Lower Rate Limit:  70     Upper Rate Limit 120 PAV 350     Sensed AV Delay:  350  Tachy Zones VF:  200     VT:  200-231     VT1:  171-200     Impression & Recommendations:  Problem # 1:  VENTRICULAR TACHYCARDIA (ICD-427.1) The patient has had no recurrent ventricular tachycardia. The working hypothesis is that this is related to the pacemaker lead; we will work on down titrating his amiodaroneand at this juncture we'll decrease it from 2 times a day to one time a day  His updated medication list for this problem includes:    Norvasc 10 Mg Tabs (Amlodipine besylate) ..... Once daily    Ramipril 5 Mg Caps (Ramipril) .Marland Kitchen... 1 cap, two times a day    Lopressor 100 Mg Tabs (Metoprolol tartrate) .Marland Kitchen..Marland Kitchen Two times a day    Coumadin 5 Mg Tabs  (Warfarin sodium) .Marland Kitchen... As directed    Amiodarone Hcl 200 Mg Tabs (Amiodarone hcl) .Marland Kitchen... Take one every 12 hours  Problem # 2:  HYPOTHYROIDISM (ICD-244.9) this is being followed by Dr. Deborah Chalk  His updated medication list for this problem includes:    Levothroid 75 Mcg Tabs (Levothyroxine sodium) .Marland Kitchen... Take 1 tablet by mouth once daily  Problem # 3:  ATRIAL FIBRILLATION (ICD-427.31) afibrillation has been absent since shocking for his ventricular tachycardia. He is atrially paced 100% of the time. A walk through the office but his heart rate went to 109  and his rate response was reprogrammed to decrease heart rate excursion His updated medication list for this problem includes:    Lopressor 100 Mg Tabs (Metoprolol tartrate) .Marland Kitchen..Marland Kitchen Two times a day    Coumadin 5 Mg Tabs (Warfarin sodium) .Marland Kitchen... As directed    Digoxin 0.125 Mg Tabs (Digoxin) .Marland Kitchen... Take 1 tablet by mouth once daily    Amiodarone Hcl 200 Mg Tabs (Amiodarone hcl) .Marland Kitchen... Take one every 12 hours  Problem # 4:  SYSTOLIC HEART FAILURE, CHRONIC (ICD-428.22) improved. He'll need his digoxin level checked His updated medication list for this problem includes:    Norvasc 10 Mg Tabs (Amlodipine besylate) ..... Once daily    Ramipril 5 Mg Caps (Ramipril) .Marland Kitchen... 1 cap, two times a day    Lopressor 100 Mg Tabs (Metoprolol tartrate) .Marland Kitchen..Marland Kitchen Two times a day    Coumadin 5 Mg Tabs (Warfarin sodium) .Marland Kitchen... As directed    Furosemide 80 Mg Tabs (Furosemide) .Marland Kitchen... Take one tablet by mouth daily.    Digoxin 0.125 Mg Tabs (Digoxin) .Marland Kitchen... Take 1 tablet by mouth once daily    Amiodarone Hcl 200 Mg Tabs (Amiodarone hcl) .Marland Kitchen... Take one every 12 hours  Problem # 5:  CARDIOMYOPATHY, ISCHEMIC STATUS POST CABG (ICD-414.8) stable on current meds His updated medication list for this problem includes:    Norvasc 10 Mg Tabs (Amlodipine besylate) ..... Once daily    Ramipril 5 Mg Caps (Ramipril) .Marland Kitchen... 1 cap, two times a day    Lopressor 100 Mg Tabs (Metoprolol  tartrate) .Marland Kitchen..Marland Kitchen Two times a day    Coumadin 5 Mg Tabs (Warfarin sodium) .Marland Kitchen... As directed    Furosemide 80 Mg Tabs (Furosemide) .Marland Kitchen... Take one tablet by mouth daily.    Digoxin 0.125 Mg Tabs (Digoxin) .Marland Kitchen... Take 1 tablet by mouth once daily    Amiodarone Hcl 200 Mg Tabs (Amiodarone hcl) .Marland Kitchen... Take one every 12 hours  Other Orders: EKG w/ Interpretation (93000)  Patient Instructions: 1)  Your physician recommends that you schedule a follow-up appointment in: APRIL WITH DR Graciela Husbands 2)  Your physician has recommended you make the following change in your medication: DECREASE AMIODARONE TO 200 MG 1 once daily   Appended Document: ECG   c gram demonstrated atrial pacing at 70 Intervals 0.14/0.09/0.41 and ST T changes diffusely with borderline voltage

## 2010-08-30 ENCOUNTER — Ambulatory Visit (INDEPENDENT_AMBULATORY_CARE_PROVIDER_SITE_OTHER): Payer: Medicare Other | Admitting: Nurse Practitioner

## 2010-08-30 DIAGNOSIS — I4891 Unspecified atrial fibrillation: Secondary | ICD-10-CM

## 2010-08-30 DIAGNOSIS — I509 Heart failure, unspecified: Secondary | ICD-10-CM

## 2010-08-30 DIAGNOSIS — Z7901 Long term (current) use of anticoagulants: Secondary | ICD-10-CM

## 2010-08-30 DIAGNOSIS — I2589 Other forms of chronic ischemic heart disease: Secondary | ICD-10-CM

## 2010-09-05 NOTE — Cardiovascular Report (Signed)
Summary: Office Visit   Office Visit   Imported By: Roderic Ovens 08/29/2010 15:59:00  _____________________________________________________________________  External Attachment:    Type:   Image     Comment:   External Document

## 2010-09-09 ENCOUNTER — Ambulatory Visit: Payer: Self-pay | Admitting: Cardiology

## 2010-09-09 LAB — BRAIN NATRIURETIC PEPTIDE: Pro B Natriuretic peptide (BNP): 1360 pg/mL — ABNORMAL HIGH (ref 0.0–100.0)

## 2010-09-09 LAB — CBC
HCT: 38 % — ABNORMAL LOW (ref 39.0–52.0)
Hemoglobin: 12.5 g/dL — ABNORMAL LOW (ref 13.0–17.0)
MCH: 29.9 pg (ref 26.0–34.0)
MCV: 90.9 fL (ref 78.0–100.0)
RBC: 4.18 MIL/uL — ABNORMAL LOW (ref 4.22–5.81)

## 2010-09-09 LAB — DIFFERENTIAL
Lymphocytes Relative: 10 % — ABNORMAL LOW (ref 12–46)
Lymphs Abs: 0.7 10*3/uL (ref 0.7–4.0)
Monocytes Relative: 10 % (ref 3–12)
Neutro Abs: 5.4 10*3/uL (ref 1.7–7.7)
Neutrophils Relative %: 76 % (ref 43–77)

## 2010-09-09 LAB — CARDIAC PANEL(CRET KIN+CKTOT+MB+TROPI)
CK, MB: 1 ng/mL (ref 0.3–4.0)
Relative Index: INVALID (ref 0.0–2.5)
Total CK: 49 U/L (ref 7–232)
Troponin I: 0.03 ng/mL (ref 0.00–0.06)

## 2010-09-09 LAB — TSH: TSH: 1.912 u[IU]/mL (ref 0.350–4.500)

## 2010-09-09 LAB — BASIC METABOLIC PANEL
CO2: 30 mEq/L (ref 19–32)
Chloride: 101 mEq/L (ref 96–112)
Creatinine, Ser: 0.91 mg/dL (ref 0.4–1.5)
GFR calc Af Amer: >60 mL/min (ref >60)
Sodium: 140 mEq/L (ref 135–145)

## 2010-09-09 LAB — DIGOXIN LEVEL: Digoxin Level: 0.6 ng/mL — ABNORMAL LOW (ref 0.8–2.0)

## 2010-09-09 LAB — APTT: aPTT: 45 seconds — ABNORMAL HIGH (ref 24–37)

## 2010-09-09 LAB — MRSA PCR SCREENING: MRSA by PCR: NEGATIVE

## 2010-09-09 LAB — MAGNESIUM: Magnesium: 2.2 mg/dL (ref 1.5–2.5)

## 2010-09-09 LAB — PROTIME-INR: INR: 1.8 — ABNORMAL HIGH (ref 0.00–1.49)

## 2010-09-10 LAB — DIFFERENTIAL
Basophils Absolute: 0 10*3/uL (ref 0.0–0.1)
Basophils Absolute: 0 10*3/uL (ref 0.0–0.1)
Basophils Relative: 0 % (ref 0–1)
Basophils Relative: 1 % (ref 0–1)
Eosinophils Absolute: 0 10*3/uL (ref 0.0–0.7)
Eosinophils Absolute: 0.2 10*3/uL (ref 0.0–0.7)
Eosinophils Absolute: 0.2 10*3/uL (ref 0.0–0.7)
Eosinophils Relative: 0 % (ref 0–5)
Eosinophils Relative: 2 % (ref 0–5)
Eosinophils Relative: 2 % (ref 0–5)
Lymphocytes Relative: 15 % (ref 12–46)
Lymphs Abs: 1.3 10*3/uL (ref 0.7–4.0)
Lymphs Abs: 1.7 10*3/uL (ref 0.7–4.0)
Monocytes Absolute: 1 10*3/uL (ref 0.1–1.0)
Monocytes Absolute: 1.2 10*3/uL — ABNORMAL HIGH (ref 0.1–1.0)
Monocytes Relative: 12 % (ref 3–12)
Neutro Abs: 6.3 10*3/uL (ref 1.7–7.7)
Neutrophils Relative %: 64 % (ref 43–77)
Neutrophils Relative %: 71 % (ref 43–77)

## 2010-09-10 LAB — GLUCOSE, CAPILLARY
Glucose-Capillary: 110 mg/dL — ABNORMAL HIGH (ref 70–99)
Glucose-Capillary: 111 mg/dL — ABNORMAL HIGH (ref 70–99)
Glucose-Capillary: 118 mg/dL — ABNORMAL HIGH (ref 70–99)
Glucose-Capillary: 125 mg/dL — ABNORMAL HIGH (ref 70–99)
Glucose-Capillary: 126 mg/dL — ABNORMAL HIGH (ref 70–99)
Glucose-Capillary: 129 mg/dL — ABNORMAL HIGH (ref 70–99)
Glucose-Capillary: 129 mg/dL — ABNORMAL HIGH (ref 70–99)
Glucose-Capillary: 130 mg/dL — ABNORMAL HIGH (ref 70–99)
Glucose-Capillary: 154 mg/dL — ABNORMAL HIGH (ref 70–99)
Glucose-Capillary: 194 mg/dL — ABNORMAL HIGH (ref 70–99)
Glucose-Capillary: 90 mg/dL (ref 70–99)
Glucose-Capillary: 91 mg/dL (ref 70–99)

## 2010-09-10 LAB — BASIC METABOLIC PANEL
CO2: 29 mEq/L (ref 19–32)
Calcium: 8.6 mg/dL (ref 8.4–10.5)
Chloride: 101 mEq/L (ref 96–112)
GFR calc Af Amer: >60 mL/min (ref >60)
Potassium: 3.4 mEq/L — ABNORMAL LOW (ref 3.5–5.1)
Sodium: 138 mEq/L (ref 135–145)

## 2010-09-10 LAB — PROTIME-INR
INR: 1.95 — ABNORMAL HIGH (ref 0.00–1.49)
INR: 2.58 — ABNORMAL HIGH (ref 0.00–1.49)
INR: 2.78 — ABNORMAL HIGH (ref 0.00–1.49)
Prothrombin Time: 22.4 seconds — ABNORMAL HIGH (ref 11.6–15.2)
Prothrombin Time: 25 seconds — ABNORMAL HIGH (ref 11.6–15.2)
Prothrombin Time: 27.5 seconds — ABNORMAL HIGH (ref 11.6–15.2)
Prothrombin Time: 27.8 seconds — ABNORMAL HIGH (ref 11.6–15.2)
Prothrombin Time: 29.4 seconds — ABNORMAL HIGH (ref 11.6–15.2)

## 2010-09-10 LAB — COMPREHENSIVE METABOLIC PANEL
ALT: 64 U/L — ABNORMAL HIGH (ref 0–53)
ALT: 80 U/L — ABNORMAL HIGH (ref 0–53)
AST: 57 U/L — ABNORMAL HIGH (ref 0–37)
AST: 57 U/L — ABNORMAL HIGH (ref 0–37)
AST: 76 U/L — ABNORMAL HIGH (ref 0–37)
Albumin: 3.3 g/dL — ABNORMAL LOW (ref 3.5–5.2)
Albumin: 3.7 g/dL (ref 3.5–5.2)
Alkaline Phosphatase: 155 U/L — ABNORMAL HIGH (ref 39–117)
Alkaline Phosphatase: 158 U/L — ABNORMAL HIGH (ref 39–117)
Alkaline Phosphatase: 160 U/L — ABNORMAL HIGH (ref 39–117)
BUN: 28 mg/dL — ABNORMAL HIGH (ref 6–23)
CO2: 24 mEq/L (ref 19–32)
CO2: 30 mEq/L (ref 19–32)
CO2: 31 mEq/L (ref 19–32)
CO2: 32 mEq/L (ref 19–32)
Calcium: 9.1 mg/dL (ref 8.4–10.5)
Chloride: 104 mEq/L (ref 96–112)
Chloride: 94 mEq/L — ABNORMAL LOW (ref 96–112)
Chloride: 96 mEq/L (ref 96–112)
Chloride: 99 mEq/L (ref 96–112)
Creatinine, Ser: 1.06 mg/dL (ref 0.4–1.5)
Creatinine, Ser: 1.11 mg/dL (ref 0.4–1.5)
GFR calc Af Amer: >60 mL/min (ref >60)
GFR calc Af Amer: >60 mL/min (ref >60)
GFR calc non Af Amer: >60 mL/min (ref >60)
GFR calc non Af Amer: >60 mL/min (ref >60)
GFR calc non Af Amer: >60 mL/min (ref >60)
Glucose, Bld: 113 mg/dL — ABNORMAL HIGH (ref 70–99)
Glucose, Bld: 125 mg/dL — ABNORMAL HIGH (ref 70–99)
Potassium: 3.7 mEq/L (ref 3.5–5.1)
Potassium: 4.2 mEq/L (ref 3.5–5.1)
Sodium: 126 mEq/L — ABNORMAL LOW (ref 135–145)
Sodium: 137 mEq/L (ref 135–145)
Sodium: 137 mEq/L (ref 135–145)
Total Bilirubin: 1.8 mg/dL — ABNORMAL HIGH (ref 0.3–1.2)
Total Bilirubin: 1.9 mg/dL — ABNORMAL HIGH (ref 0.3–1.2)
Total Protein: 6.8 g/dL (ref 6.0–8.3)

## 2010-09-10 LAB — CBC
HCT: 37.8 % — ABNORMAL LOW (ref 39.0–52.0)
HCT: 39.1 % (ref 39.0–52.0)
HCT: 39.4 % (ref 39.0–52.0)
Hemoglobin: 12.1 g/dL — ABNORMAL LOW (ref 13.0–17.0)
Hemoglobin: 12.5 g/dL — ABNORMAL LOW (ref 13.0–17.0)
Hemoglobin: 12.8 g/dL — ABNORMAL LOW (ref 13.0–17.0)
Hemoglobin: 12.9 g/dL — ABNORMAL LOW (ref 13.0–17.0)
MCH: 28.6 pg (ref 26.0–34.0)
MCH: 28.6 pg (ref 26.0–34.0)
MCH: 28.9 pg (ref 26.0–34.0)
MCHC: 32 g/dL (ref 30.0–36.0)
MCHC: 32.7 g/dL (ref 30.0–36.0)
MCHC: 32.7 g/dL (ref 30.0–36.0)
MCV: 89.6 fL (ref 78.0–100.0)
Platelets: 182 10*3/uL (ref 150–400)
RBC: 4.27 MIL/uL (ref 4.22–5.81)
RBC: 4.32 MIL/uL (ref 4.22–5.81)
RBC: 4.48 MIL/uL (ref 4.22–5.81)
RBC: 4.51 MIL/uL (ref 4.22–5.81)
WBC: 8.9 10*3/uL (ref 4.0–10.5)
WBC: 9.1 10*3/uL (ref 4.0–10.5)

## 2010-09-10 LAB — BRAIN NATRIURETIC PEPTIDE: Pro B Natriuretic peptide (BNP): 2992 pg/mL — ABNORMAL HIGH (ref 0.0–100.0)

## 2010-09-10 LAB — HEMOGLOBIN A1C
Hgb A1c MFr Bld: 7 % — ABNORMAL HIGH (ref <5.7)
Mean Plasma Glucose: 154 mg/dL — ABNORMAL HIGH (ref <117)

## 2010-09-10 LAB — CARDIAC PANEL(CRET KIN+CKTOT+MB+TROPI): Relative Index: 2.7 — ABNORMAL HIGH (ref 0.0–2.5)

## 2010-09-12 LAB — CBC
HCT: 35.9 % — ABNORMAL LOW (ref 39.0–52.0)
HCT: 37.4 % — ABNORMAL LOW (ref 39.0–52.0)
Hemoglobin: 11.7 g/dL — ABNORMAL LOW (ref 13.0–17.0)
Hemoglobin: 12.1 g/dL — ABNORMAL LOW (ref 13.0–17.0)
MCH: 28.3 pg (ref 26.0–34.0)
MCH: 29 pg (ref 26.0–34.0)
MCHC: 32.4 g/dL (ref 30.0–36.0)
MCHC: 32.6 g/dL (ref 30.0–36.0)

## 2010-09-12 LAB — BASIC METABOLIC PANEL
CO2: 30 mEq/L (ref 19–32)
CO2: 31 mEq/L (ref 19–32)
Calcium: 9.4 mg/dL (ref 8.4–10.5)
Glucose, Bld: 114 mg/dL — ABNORMAL HIGH (ref 70–99)
Glucose, Bld: 95 mg/dL (ref 70–99)
Potassium: 4.1 mEq/L (ref 3.5–5.1)
Potassium: 4.5 mEq/L (ref 3.5–5.1)
Sodium: 142 mEq/L (ref 135–145)
Sodium: 143 mEq/L (ref 135–145)

## 2010-09-16 LAB — CBC
HCT: 37 % — ABNORMAL LOW (ref 39.0–52.0)
MCHC: 32.8 g/dL (ref 30.0–36.0)
MCHC: 33.1 g/dL (ref 30.0–36.0)
MCV: 89.2 fL (ref 78.0–100.0)
MCV: 89.3 fL (ref 78.0–100.0)
Platelets: 173 10*3/uL (ref 150–400)
Platelets: 188 10*3/uL (ref 150–400)
RDW: 16.4 % — ABNORMAL HIGH (ref 11.5–15.5)
WBC: 11 10*3/uL — ABNORMAL HIGH (ref 4.0–10.5)

## 2010-09-16 LAB — APTT
aPTT: 34 seconds (ref 24–37)
aPTT: 59 seconds — ABNORMAL HIGH (ref 24–37)

## 2010-09-16 LAB — BASIC METABOLIC PANEL
BUN: 8 mg/dL (ref 6–23)
CO2: 30 mEq/L (ref 19–32)
Chloride: 102 mEq/L (ref 96–112)
Creatinine, Ser: 0.82 mg/dL (ref 0.4–1.5)
Glucose, Bld: 158 mg/dL — ABNORMAL HIGH (ref 70–99)
Potassium: 3.6 mEq/L (ref 3.5–5.1)

## 2010-09-16 LAB — URINALYSIS, ROUTINE W REFLEX MICROSCOPIC
Ketones, ur: NEGATIVE mg/dL
Nitrite: NEGATIVE
Urobilinogen, UA: 1 mg/dL (ref 0.0–1.0)
pH: 7 (ref 5.0–8.0)

## 2010-09-16 LAB — URINE MICROSCOPIC-ADD ON

## 2010-09-16 LAB — GLUCOSE, CAPILLARY
Glucose-Capillary: 129 mg/dL — ABNORMAL HIGH (ref 70–99)
Glucose-Capillary: 130 mg/dL — ABNORMAL HIGH (ref 70–99)
Glucose-Capillary: 195 mg/dL — ABNORMAL HIGH (ref 70–99)

## 2010-09-16 LAB — COMPREHENSIVE METABOLIC PANEL
AST: 24 U/L (ref 0–37)
Albumin: 3.2 g/dL — ABNORMAL LOW (ref 3.5–5.2)
CO2: 30 mEq/L (ref 19–32)
Calcium: 8.6 mg/dL (ref 8.4–10.5)
Creatinine, Ser: 1.18 mg/dL (ref 0.4–1.5)
GFR calc Af Amer: >60 mL/min (ref >60)
GFR calc non Af Amer: >60 mL/min (ref >60)

## 2010-09-16 LAB — URINE CULTURE: Special Requests: NEGATIVE

## 2010-09-16 LAB — PROTIME-INR: INR: 1.25 (ref 0.00–1.49)

## 2010-09-27 ENCOUNTER — Other Ambulatory Visit (INDEPENDENT_AMBULATORY_CARE_PROVIDER_SITE_OTHER): Payer: Medicare Other | Admitting: *Deleted

## 2010-09-27 ENCOUNTER — Ambulatory Visit: Payer: Self-pay | Admitting: *Deleted

## 2010-09-27 ENCOUNTER — Ambulatory Visit (INDEPENDENT_AMBULATORY_CARE_PROVIDER_SITE_OTHER): Payer: Self-pay | Admitting: *Deleted

## 2010-09-27 DIAGNOSIS — I4891 Unspecified atrial fibrillation: Secondary | ICD-10-CM

## 2010-09-27 NOTE — Patient Instructions (Signed)
CSD on Coumadin

## 2010-09-30 ENCOUNTER — Encounter: Payer: Self-pay | Admitting: Cardiology

## 2010-10-15 ENCOUNTER — Ambulatory Visit (INDEPENDENT_AMBULATORY_CARE_PROVIDER_SITE_OTHER): Payer: Medicare Other | Admitting: Internal Medicine

## 2010-10-15 ENCOUNTER — Encounter: Payer: Self-pay | Admitting: Internal Medicine

## 2010-10-15 DIAGNOSIS — Z79899 Other long term (current) drug therapy: Secondary | ICD-10-CM

## 2010-10-15 DIAGNOSIS — I4891 Unspecified atrial fibrillation: Secondary | ICD-10-CM

## 2010-10-15 DIAGNOSIS — Z95810 Presence of automatic (implantable) cardiac defibrillator: Secondary | ICD-10-CM | POA: Insufficient documentation

## 2010-10-15 DIAGNOSIS — I5022 Chronic systolic (congestive) heart failure: Secondary | ICD-10-CM

## 2010-10-15 DIAGNOSIS — I472 Ventricular tachycardia: Secondary | ICD-10-CM

## 2010-10-15 LAB — HEPATIC FUNCTION PANEL
ALT: 58 U/L — ABNORMAL HIGH (ref 0–53)
Albumin: 3.6 g/dL (ref 3.5–5.2)
Bilirubin, Direct: 0.2 mg/dL (ref 0.0–0.3)
Total Protein: 6.3 g/dL (ref 6.0–8.3)

## 2010-10-15 NOTE — Progress Notes (Signed)
HPI  Ian Williams is a 77 y.o. male seen in followup the floor ICD implanted for primary prevention in the setting of ischemic heart disease progressive left ventricular dysfunction and prior bypass.  His device implantation was complicated by ventricular tachycardia. which developed after ICD implantation and for  which he was appropriately shocked. he then had other episodes of ventricular tachycardia . He also was cardioverted from  what was thought to be permanent atrial fibrillation to sinus rhythm. He then underwent device revision with repositioning of the right ventricular lead because of the concern for pacing lead induced arrhythmia. He also received an atrial lead at that time because of the reversion to sinus rhythm.  Was treated concurrently with amiodarone which was initially associated with nausea and lightheadedness. These have abated markedly with down titration.  At the time of his upgrade he also received An LV lead. However, He noted  that his breathing wasconsiderably better prior to CRT implantation; His LV lead was turned off and he feels a good deal better\  .No past medical history on file.  No past surgical history on file.  Current Outpatient Prescriptions  Medication Sig Dispense Refill  . allopurinol (ZYLOPRIM) 300 MG tablet Take 300 mg by mouth daily.        Marland Kitchen amiodarone (PACERONE) 200 MG tablet Take 200 mg by mouth daily.        Marland Kitchen amLODipine (NORVASC) 10 MG tablet Take 10 mg by mouth daily.        . cloNIDine (CATAPRES) 0.3 MG tablet Take 0.3 mg by mouth daily.        . digoxin (LANOXIN) 0.125 MG tablet Take 125 mcg by mouth daily.        Marland Kitchen ezetimibe (ZETIA) 10 MG tablet Take 10 mg by mouth daily. 1/2 tablet       . famotidine (PEPCID) 20 MG tablet Take 20 mg by mouth daily.        . furosemide (LASIX) 80 MG tablet Take 80 mg by mouth daily.        Marland Kitchen levothyroxine (SYNTHROID, LEVOTHROID) 75 MCG tablet Take 75 mcg by mouth daily.        . metoprolol  (LOPRESSOR) 100 MG tablet Take 100 mg by mouth 2 (two) times daily.        . Multiple Vitamins-Minerals ([employer] SPECTRAVITE ADVANCED PO) Take 1 tablet by mouth daily.        . Multiple Vitamins-Minerals (OCUVITE ADULT 50+) CAPS Take 1 capsule by mouth 2 (two) times daily.        . niacin (NIASPAN) 500 MG CR tablet Take 500 mg by mouth at bedtime.        . Omega-3 Fatty Acids (FISH OIL) 1000 MG CAPS Take 1 capsule by mouth 2 (two) times daily.        . potassium chloride SA (K-DUR,KLOR-CON) 20 MEQ tablet Take 20 mEq by mouth 2 (two) times daily.        . ramipril (ALTACE) 5 MG capsule Take 5 mg by mouth 2 (two) times daily.        Marland Kitchen warfarin (COUMADIN) 5 MG tablet Take 5 mg by mouth daily. UAD          Allergies  Allergen Reactions  . Penicillins   . Sulfonamide Derivatives     Review of Systems negative except from HPI and PMH  Physical Exam Well developed and well nourished in no acute distress HENT normal E scleral and icterus clear  Neck Supple JVP flat; carotids brisk and full Pacemaker/ICD pocket is associated with significant atrophy at the syrup here margin. The skin is relatively thin but there is tissue movement across the surface of the device. There is no warmth. There is a little bit of erythema Clear to ausculation Regular rate and rhythm, no murmurs gallops or rub Soft with active bowel sounds No clubbing cyanosis and edema Alert and oriented, grossly normal motor and sensory function Skin Warm and Dry  ECG  Assessment and  Plan

## 2010-10-15 NOTE — Patient Instructions (Signed)
Your physician recommends that you schedule a follow-up appointment in: 3 MONTHS WITH DR Graciela Husbands Your physician recommends that you continue on your current medications as directed. Please refer to the Current Medication list given to you today. Your physician recommends that you return for lab work in: LIVER AND TSH V58.69

## 2010-10-15 NOTE — Assessment & Plan Note (Signed)
No recurrent ventricular tachycardia. He is tolerating the amiodarone. We reviewed phototoxicity. We'll check his amiodarone surveillance laboratories today.

## 2010-10-15 NOTE — Assessment & Plan Note (Signed)
No intercurrent atrial fibrillation 

## 2010-10-15 NOTE — Assessment & Plan Note (Signed)
The patient's device was interrogated.  The information was reviewed. No changes were made in the programming.    

## 2010-10-15 NOTE — Assessment & Plan Note (Signed)
Stable on current meds 

## 2010-10-18 ENCOUNTER — Other Ambulatory Visit: Payer: Self-pay | Admitting: *Deleted

## 2010-10-18 DIAGNOSIS — E039 Hypothyroidism, unspecified: Secondary | ICD-10-CM

## 2010-10-21 ENCOUNTER — Encounter: Payer: Medicare Other | Admitting: *Deleted

## 2010-10-23 ENCOUNTER — Encounter: Payer: Self-pay | Admitting: *Deleted

## 2010-10-25 ENCOUNTER — Other Ambulatory Visit (INDEPENDENT_AMBULATORY_CARE_PROVIDER_SITE_OTHER): Payer: Medicare Other | Admitting: *Deleted

## 2010-10-25 ENCOUNTER — Ambulatory Visit (INDEPENDENT_AMBULATORY_CARE_PROVIDER_SITE_OTHER): Payer: Medicare Other | Admitting: Cardiology

## 2010-10-25 ENCOUNTER — Encounter: Payer: Self-pay | Admitting: Cardiology

## 2010-10-25 ENCOUNTER — Ambulatory Visit (INDEPENDENT_AMBULATORY_CARE_PROVIDER_SITE_OTHER): Payer: Medicare Other | Admitting: *Deleted

## 2010-10-25 DIAGNOSIS — I4891 Unspecified atrial fibrillation: Secondary | ICD-10-CM

## 2010-10-25 DIAGNOSIS — E039 Hypothyroidism, unspecified: Secondary | ICD-10-CM

## 2010-10-25 DIAGNOSIS — I5022 Chronic systolic (congestive) heart failure: Secondary | ICD-10-CM

## 2010-10-25 LAB — POCT INR: INR: 1.9

## 2010-10-25 NOTE — Progress Notes (Signed)
Subjective:   Ian Williams is seen today for followup visit. Overall, he's been doing reasonably well. He has occasional dizziness and occasional headaches but no chest pain or significant shortness of breath. He tends to fall a somewhat steady routine but is able to get out and do some light mowing.  He has known ischemic heart disease with previous coronary artery bypass grafting in 2001. He had a cardiac arrest and residual encephalopathy and has recovered and has remained functional. His cardiac arrest with his presenting symptoms. He has not had recurrent anginal symptoms. His last stress Cardiolite study was in September 2009 with an ejection fraction of 41% with inferior septal hypokinesis and no ischemia. 2-D echocardiogram from August 2011 showed a different estimated ejection fraction of 20-25%.  He a single lead ICD in November of 2011 by Dr. Graciela Husbands and had revision in January 2012. Has history of chronic atrial fibrillation managed on chronic warfarin. He has known peripheral vascular disease, previous left carotid endarterectomy, stenosis of the superior mesenteric artery, as well as iliac disease. His a history of hypertension. He has hypercholesterolemia and tolerates Niaspan and Lipitor well. Other issues include kidney stones and hernia surgery. He had a TURP on may 24th, 2011.  Current Outpatient Prescriptions  Medication Sig Dispense Refill  . allopurinol (ZYLOPRIM) 300 MG tablet Take 300 mg by mouth daily.        Marland Kitchen amiodarone (PACERONE) 200 MG tablet Take 200 mg by mouth daily.        Marland Kitchen amLODipine (NORVASC) 10 MG tablet Take 10 mg by mouth daily.        . cloNIDine (CATAPRES) 0.3 MG tablet Take 0.3 mg by mouth daily.        . digoxin (LANOXIN) 0.125 MG tablet Take 125 mcg by mouth daily.        Marland Kitchen ezetimibe (ZETIA) 10 MG tablet Take 10 mg by mouth daily. 1/2 tablet       . famotidine (PEPCID) 20 MG tablet Take 20 mg by mouth daily.        . furosemide (LASIX) 80 MG tablet Take 80 mg by mouth  daily.        Marland Kitchen levothyroxine (SYNTHROID, LEVOTHROID) 75 MCG tablet Take 88 mcg by mouth daily.       . metoprolol (LOPRESSOR) 100 MG tablet Take 100 mg by mouth 2 (two) times daily.        . Multiple Vitamins-Minerals ([employer]S SPECTRAVITE ADVANCED PO) Take 1 tablet by mouth daily.        . Multiple Vitamins-Minerals (OCUVITE ADULT 50+) CAPS Take 1 capsule by mouth 2 (two) times daily.        . niacin (NIASPAN) 500 MG CR tablet Take 500 mg by mouth at bedtime.        . Omega-3 Fatty Acids (FISH OIL) 1000 MG CAPS Take 1 capsule by mouth 2 (two) times daily.        . potassium chloride SA (K-DUR,KLOR-CON) 20 MEQ tablet Take 20 mEq by mouth 2 (two) times daily.        . ramipril (ALTACE) 5 MG capsule Take 5 mg by mouth 2 (two) times daily.        Marland Kitchen warfarin (COUMADIN) 5 MG tablet Take 5 mg by mouth daily. UAD          Allergies  Allergen Reactions  . Penicillins   . Sulfonamide Derivatives     Patient Active Problem List  Diagnoses  . ATRIAL FIBRILLATION  .  SYSTOLIC HEART FAILURE, CHRONIC  . SNORING  . HYPOTHYROIDISM  . VENTRICULAR TACHYCARDIA  . Biventricular ICD  -LV lead off secondary to increased dyspnea    History  Smoking status  . Former Smoker -- 1.0 packs/day  . Types: Cigarettes  . Quit date: 07/01/1999  Smokeless tobacco  . Never Used    History  Alcohol Use  . Yes    Family History  Problem Relation Age of Onset  . Stroke Father   . Cancer Mother   . Cardiomyopathy Brother     Review of Systems:   The patient denies any heat or cold intolerance.  No weight gain or weight loss.  The patient denies headaches or blurry vision.  There is no cough or sputum production.  The patient denies dizziness.  There is no hematuria or hematochezia.  The patient denies any muscle aches or arthritis.  The patient denies any rash.  The patient denies frequent falling or instability.  There is no history of depression or anxiety.  All other systems were reviewed and are  negative.   Physical Exam:   His weights 145. Blood pressure is 90/60 sitting, heart rate 68. ICD is in the left upper chest.The head is normocephalic and atraumatic.  Pupils are equally round and reactive to light.  Sclerae nonicteric.  Conjunctiva is clear.  Oropharynx is unremarkable.  There's adequate oral airway.  Neck is supple there are no masses.  Thyroid is not enlarged.  There is no lymphadenopathy.  Lungs are clear.  Chest is symmetric.  Heart shows a regular rate and rhythm.  S1 and S2 are normal.  There is no murmur click or gallop.  Abdomen is soft normal bowel sounds.  There is no organomegaly.  Genital and rectal deferred.  Extremities are without edema.  Peripheral pulses are adequate.  Neurologically intact.  Full range of motion.  The patient is not depressed.  Skin is warm and dry.  Assessment / Plan:

## 2010-10-25 NOTE — Assessment & Plan Note (Signed)
Overall he's tolerating his current medications well and has not had recurrent congestive heart failure. His INR is 1.9 and we will increase his warfarin to 5 mg x6 days a week and 2.5 mg x1. I will have him see Dr. Antoine Poche in 4 months. We will check INR in 2 weeks.

## 2010-10-25 NOTE — Assessment & Plan Note (Signed)
He remains on amiodarone, 200 mg a day as well as warfarin.

## 2010-10-28 ENCOUNTER — Encounter: Payer: Medicare Other | Admitting: *Deleted

## 2010-11-05 ENCOUNTER — Other Ambulatory Visit: Payer: Self-pay | Admitting: *Deleted

## 2010-11-05 MED ORDER — WARFARIN SODIUM 5 MG PO TABS: 5.0000 mg | ORAL_TABLET | Freq: Every day | ORAL | Status: DC

## 2010-11-05 NOTE — Telephone Encounter (Signed)
Refill to [facility]

## 2010-11-08 ENCOUNTER — Ambulatory Visit (INDEPENDENT_AMBULATORY_CARE_PROVIDER_SITE_OTHER): Payer: Medicare Other | Admitting: *Deleted

## 2010-11-08 DIAGNOSIS — I4891 Unspecified atrial fibrillation: Secondary | ICD-10-CM

## 2010-11-08 LAB — POCT INR: INR: 2.1

## 2010-11-12 ENCOUNTER — Other Ambulatory Visit (INDEPENDENT_AMBULATORY_CARE_PROVIDER_SITE_OTHER): Payer: Medicare Other | Admitting: *Deleted

## 2010-11-12 DIAGNOSIS — Z79899 Other long term (current) drug therapy: Secondary | ICD-10-CM

## 2010-11-12 LAB — HEPATIC FUNCTION PANEL
AST: 46 U/L — ABNORMAL HIGH (ref 0–37)
Albumin: 3.7 g/dL (ref 3.5–5.2)
Total Bilirubin: 0.9 mg/dL (ref 0.3–1.2)

## 2010-11-12 NOTE — Procedures (Signed)
CAROTID DUPLEX EXAM   INDICATION:  Carotid Dopplers.   HISTORY:  Diabetes:  Yes.  Cardiac:  MI, CABG.  Hypertension:  Yes.  Smoking:  Previous.  Previous Surgery:  Left carotid endarterectomy on 09/29/2001.  CV History:  Asymptomatic.  Amaurosis Fugax No, Paresthesias No, Hemiparesis No                                       RIGHT             LEFT  Brachial systolic pressure:         130               128  Brachial Doppler waveforms:         Normal            Normal  Vertebral direction of flow:        Antegrade         Antegrade  DUPLEX VELOCITIES (cm/sec)  CCA peak systolic                   72                81  ECA peak systolic                   133               98  ICA peak systolic                   109               50  ICA end diastolic                   32                20  PLAQUE MORPHOLOGY:                  Mixed             Mixed  PLAQUE AMOUNT:                      Mild/moderate     Mild  PLAQUE LOCATION:                    ICA/ECA           ECA   IMPRESSION:  1. Doppler velocities suggest a 1-39% stenosis of the right internal      carotid artery.  2. Patent left carotid endarterectomy site with no evidence of left      internal carotid artery stenosis.  3. No significant change noted when compared to the previous exam on      01/14/2006.  4. A preliminary report was faxed to Dr. Ronnald Nian office on      09/14/2008.   ___________________________________________  Quita Skye. Hart Rochester, M.D.   CH/MEDQ  D:  09/14/2008  T:  09/14/2008  Job:  [id number]

## 2010-11-13 ENCOUNTER — Other Ambulatory Visit: Payer: Self-pay | Admitting: *Deleted

## 2010-11-13 DIAGNOSIS — Z79899 Other long term (current) drug therapy: Secondary | ICD-10-CM

## 2010-11-15 NOTE — H&P (Signed)
[place]. [facility]  Patient:    Ian Williams, Ian Williams                       MRN: [id number] Adm. Date:  02/24/00 Attending:  Colleen Can. Deborah Chalk, M.D. Dictator:   Jennet Maduro. Earl Gala, R.N., A.N.P. CC:         Colleen Can. Deborah Chalk, M.D.  Rodrigo Ran, M.D.  Dr. Dorothey Baseman, c/o Emergency Room, [facility], [place], [place]   History and Physical  CHIEF COMPLAINT: Mr. Bunn is a 77 year old white male who reported has had no previous cardiac history except for hypertensive heart disease.  He presents for hospitalized this afternoon following a ventricular fibrillation arrest approximately two to three hours prior to his presentation here at [facility].  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  CPR was initiated by the [facility] staff for approximately ten minutes prior to arrival of EMS.  Resuscitation consisted of three shocks, three doses of epinephrine, two doses of Atropine, two doses of lidocaine, as well as one dose of bicarbonate.  Upon arrival of EMS he was defibrillated once again to sinus tachycardia.  He was briefly intubated for only about one hour and given IV lidocaine.  He was subsequently transferred to the emergency room at [facility], where he was subsequently extubated and was alert and talkative.  He had previously had some chest pain earlier in the day while walking up some steps.  He is now admitted for further evaluation.  PAST MEDICAL HISTORY (basically obtained from the family):  1. History of hypertension.  2. Kidney stones, status post lithotripsy as well as nephrolithotomy.  3. Hernia surgery x 2.  There is no report of diabetes or other medical problems at this time.  ALLERGIES:  1.  PENICILLIN.  2. SULFA.  CURRENT MEDICATIONS:  1. Atenolol.  2. Potassium.  3. Clonidine.  4. Hydrochlorothiazide.  5. Aspirin.  The medication dosages are all unknown per the family.  FAMILY HISTORY: Father died with stroke.  Mother died with cancer.  He has one son who does have a defibrillator in place due to sudden cardiac death, felt to be due to cardiomyopathy.  SOCIAL HISTORY: He is married and has ten children.  He has been a smoker of one pack per day of cigarettes.  He does engage in alcohol and drinks two drinks per day.  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  REVIEW OF SYSTEMS (obtained from the family): Basically unremarkable.  There has been on trouble with his head, eyes, ears, nose, or throat.  There has been no previous history of syncope.  No abdominal pain.  No constipation, no diarrhea.  He does have a history of arthritis.  PHYSICAL EXAMINATION:  GENERAL: He is awake and just slightly lethargic, but able to respond appropriately.  VITAL SIGNS: Blood pressure 106/40 initially and down to 86/43.  Heart rate was in the 90s.  Respirations 20.  Oxygen saturation 98% currently on 100% mask.  SKIN: Warm and dry.  Color somewhat flushed, especially in the face.  NECK: Supple.  LUNGS: Coarse sounds.  HEART: Regular rhythm.  Slightly tachycardic.  ABDOMEN: Soft with positive bowel sounds.  EXTREMITIES: Pulses intact distally and bilaterally.  LABORATORY DATA: Laboratory data  from [facility] shows a hematocrit of 43, WBC 10; platelets 196,000.  Chemistries reveal a sodium of 139, potassium 3.4, chloride 103, CO2 17, BUN 28, creatinine 1.7, glucose 247. CK and troponin negative.  EKG showed sinus tachycardia with marked ST-T wave changes to suggest ischemia.  OVERALL IMPRESSION:  1. Status post cardiac arrest.  2. Previous history of chest pain today.  3. Hypertension.  4. Ongoing tobacco abuse.  PLAN: He  is taken acutely to the cardiac catheterization laboratory by Dr. Roger Shelter.  The risks and benefits of the procedure have been explained to the family and they are willing to proceed.  The patient did receive IV amiodarone and this will be continued.  Will recheck all laboratory work and will follow up appropriately. DD:  02/24/00 TD:  02/24/00 Job: [id number]

## 2010-11-15 NOTE — H&P (Signed)
[place]. [facility]  Patient:    Ian Williams, Ian Williams Visit Number: [id number] MRN: [id number]          Service Type: SUR Location: [id number] Attending Physician:  Colvin Caroli Dictated by:   Quita Skye Hart Rochester, M.D. Admit Date:  09/29/2001 Discharge Date: 09/30/2001                           History and Physical  CHIEF COMPLAINT:  Severe left internal carotid artery stenosis, asymptomatic.  HISTORY OF PRESENT ILLNESS:  This 77 year old male patient was seen by Rodrigo Ran, M.D., his medical doctor, and found to have carotid bruits.  He was referred to [facility] and a carotid duplex exam was obtained on September 28, 2001, which revealed a greater than 95% left internal carotid artery stenosis with mild right internal carotid artery stenosis.  The patient denies hemispheric and nonhemispheric TIAs, amaurosis fugax, previous CVA, or syncope.  He does have a history of occasional dizziness when first arising, which attributes to his Altace antihypertensive medication.  PAST MEDICAL HISTORY:  Significant for previous coronary artery bypass grafting by Gwenith Daily. Tyrone Sage, M.D., in August of 2001.  The patient suffered an acute myocardial infarction with ventricular fibrillation and arrest in [place], [place], and this led to his coronary artery bypass grafting a few days later.  He has done well from a cardiac standpoint since that time and is followed by Colleen Can. Deborah Chalk, M.D.  The patient denies diabetes mellitus.  He does have a history of hypertension and hypercholesterolemia treated medically.  He denies a history of cancer, chronic obstructive pulmonary disease, deep venous thrombosis, thrombophlebitis, or pulmonary emboli.  He does have arthritis and gout.  MEDICATIONS:  1. Lipitor 10 mg a day.  2. Norvasc 5 mg a day.  3. Metoprolol 100 mg b.i.d.  4. Altace 5 mg b.i.d.  5. Hydrochlorothiazide 25 mg one a day.  6. Clonidine 0.3 mg one  a day.  7. ______ M20 one a day.  8. Spectrovite Senior multivitamin one a day.  9. Aspirin 325 mg one a day. 10. Indomethacin 50 mg one capsule every four hours if needed for gout relief.  ALLERGIES:  None.  SOCIAL HISTORY:  The patient has not smoked since his heart surgery, but did prior to that, one pack per day.  He does use alcohol on a regular basis.  He is retired as a Hotel manager.  FAMILY HISTORY:  Positive for colon cancer, stroke, and heart disease.  REVIEW OF SYSTEMS:  The patient denies any anorexia or weight loss.  He does occasionally have dyspnea on exertion, but denies asthma, wheezing, chronic obstructive pulmonary disease, and recurrent pneumonia.  He also has no GI symptoms at this time, but does have urinary frequency.  He has lower extremity discomfort with walking, but no history of nonhealing ulcers or rest pain.  Otherwise unremarkable.  PHYSICAL EXAMINATION:  The blood pressure is 124/84 in the left arm and 144/78 in the right arm, heart rate 64, and respirations 18.  GENERAL APPEARANCE:  He is a healthy-appearing male patient who is in no apparent distress.  He is alert and oriented x 3.  NECK:  Supple with 3+ carotid pulses.  There is a high-pitched bruit over the left carotid bifurcation and a softer bruit on the right.  NEUROLOGIC:  Exam is normal.  LYMPHATICS:  There is no palpable adenopathy in the neck.  CHEST:  Clear to auscultation and percussion.  CARDIOVASCULAR:  Regular rate and rhythm with no murmurs.  ABDOMEN:  Soft and nontender with no masses.  He has 2+ femoral, 2+ popliteal, and 1+ dorsalis pedis pulses bilaterally.  LABORATORY DATA:  A carotid duplex exam on September 28, 2001, at [facility] revealed a velocity of 623 cm/sec on the left with a quite severe internal carotid artery stenosis and a mild stenosis in the right internal carotid.  IMPRESSION: 1. Severe left internal carotid artery stenosis, asymptomatic. 2. Severe  coronary artery disease, status post coronary artery bypass    grafting, stable at the present time. 3. Hypertension. 4. History of renal calculi. 5. Status post hernia repairs.  PLAN:  The patient is scheduled for left carotid endarterectomy on Wednesday, September 29, 2001. Dictated by:   Quita Skye Hart Rochester, M.D. Attending Physician:  Colvin Caroli DD:  09/28/01 TD:  09/28/01 Job: [id number]

## 2010-11-15 NOTE — Procedures (Signed)
[place]. [facility]  Patient:    EDD, [name]                     MRN: [id number] Proc. Date: 02/28/00 Adm. Date:  [id number] Attending:  Waldo Laine                           Procedure Report  PROCEDURE: Transesophageal echocardiogram.  ANESTHESIOLOGIST: Judie Petit, M.D.  INDICATIONS FOR PROCEDURE: This patient is a 77 year old male, who presents today for coronary artery bypass grafting and possible mitral valve repair. TEE was requested by Dr. Tyrone Sage today.  DESCRIPTION OF PROCEDURE: The patient was brought to the holding area the morning of surgery, where radial artery and pulmonary artery catheters were placed under local anesthesia with sedation.  The patient was then taken to the operating room for routine induction of anesthesia and endotracheally intubated.  Following intubation of the trachea the TEE probe was passed oropharyngeally into the stomach and then withdrawn for imaging of the cardiac structures.  Precardial pulmonary bypass:  LEFT VENTRICLE: Overall left ventricular function markedly improved from transthoracic echocardiogram, 35-40%.  Papillary muscles were well outlined. No masses were noted within the left ventricular chamber.  MITRAL VALVE: The mitral valve appeared to be functioning appropriately and closed appropriately during systolic ejection.  On Doppler examination there was mild mitral regurgitant flow.  There was no evidence of significant regurgitation.  LEFT ATRIUM: The appendage was able to be visualized.  There were no masses noted.  Atrial septum appeared to be intact.  RIGHT VENTRICLE/TRICUSPID VALVE: Somewhat difficult to visualize, but otherwise normal.  AORTIC VALVE: Three leaflets were visualized and no evidence of significant regurgitant flow noted.  Minimal to mild aortic insufficiency.  The patient was placed on cardiopulmonary bypass and coronary artery bypass grafting was  carried out without difficulty.  The patient was rewarmed and separated from cardiopulmonary bypass with the initial attempt.  The mitral valve was not repaired.  Post cardiopulmonary bypass limited examination.  Left ventricle in early bypass.  The chamber revealed low volume but with adequate replacement of volume to contractile pattern, looked improved.  There was no change in the mitral valve regurgitant flow.  The rest of the cardiac examination was as previously described.  The patient was subsequently returned to the cardiac intensive care unit in stable condition. DD:  02/28/00 TD:  02/28/00 Job: [id number]

## 2010-11-15 NOTE — Op Note (Signed)
[place]. [facility]  Patient:    Ian Williams, Ian Williams                     MRN: [id number] Proc. Date: 02/28/00 Adm. Date:  [id number] Attending:  Waldo Laine                           Operative Report  PREOPERATIVE DIAGNOSIS: Coronary occlusive disease, with recent myocardial infarction, and ventricular fibrillation at rest.  POSTOPERATIVE DIAGNOSIS: Coronary occlusive disease, with recent myocardial infarction, and ventricular fibrillation at rest.  OPERATION/PROCEDURE: Coronary artery bypass grafting x 4.  SURGEON: Gwenith Daily. Tyrone Sage, M.D. DD:  03/04/00 TD:  03/04/00 Job: [id number]

## 2010-11-15 NOTE — Cardiovascular Report (Signed)
[place]. [facility]  Patient:    KYRE, JEFFRIES                     MRN: [id number] Proc. Date: 02/24/00 Adm. Date:  [id number] Attending:  Eleanora Neighbor CC:         Rodrigo Ran, M.D.  Dr. Dorothey Baseman, [facility]   Cardiac Catheterization  NAME OF PROCEDURES: 1. Left heart catheterization. 2. Selective coronary angiography. 3. Left ventricular angiography.  CARDIOLOGIST:  Colleen Can. Deborah Chalk, M.D.  HISTORY:  Mr. 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  He had outpatient hospital resuscitation, was intubated for less than an hour and is referred for evaluation and management.  He had no acute ST segment elevation and no history of chest pain, but had diffuse ST segment depression.  He is now referred for catheterization.  TYPE AND SITE OF ENTRY:  Percutaneous right femoral artery with attempted per close.  CATHETERS:  6 French 4 curved Judkins right and left coronary catheters; 6 French pigtail ventricular catheter.  CONTRAST MATERIAL:  Omnipaque.  MEDICATION GIVEN DURING THE PROCEDURE:  None.  MEDICATION GIVEN PRIOR TO PROCEDURE:  Amiodarone and heparin.  COMMENTS: The patient tolerated the procedure well.  HEMODYNAMIC DATA:  The aortic pressure was 103//66, LV was 103/27. There is no aortic valve gradient noted on pull back.  ANGIOGRAPHIC DATA:  Left ventricular angiogram was performed in the RAO projection.  Overall cardiac size was within normal limits.  There was rather global left ventricular dysfunction, in particular, there was an anterior wall akinetic zone.  There was minimal movement inferiorly and n the anterior basilar portion.  Calculated ejection fraction was 31%, but the impression would be an ejection fraction of 20-25%.  There was 2+ mitral regurgitation.  CORONARY ARTERIES:  The coronary arteries arise and distribute  normally. 1. Right Coronary Artery:  The right coronary artery was a dominant vessel. It is totally occluded proximally.  There is a conus branch that gives collaterals to the left anterior descending. There is a large right coronary artery that receives collaterals from the left system.  There are three branches on the inferior wall. There appears to be a severe stenosis at the crux prior to the origin of the posterior descending vessel. 2. Left Main Coronary Artery:  Short and normal. 3. Left Anterior Descending:  The left anterior descending is totally occluded after the origin of the large diagonal vessel. This left anterior descending continuation branch fills with collaterals from the right coronary artery. There is diffuse 80-90% stenosis in the proximal diagonal vessel and it extends to the anterior apical wall.  There is a severe stenosis in a large septal perforating branch. 4. Left Circumflex:  The left circumflex gives rise to three obtuse marginals. There is moderate stenosis before the first obtuse marginal and then a 90% stenosis in this vessel.  There is severe stenosis between the first and second obtuse marginals.  The second obtuse marginal has a 60-70% stenosis. There is no stenosis in the third marginal vessel.  OVERALL IMPRESSION: 1. Severe left ventricular dysfunction with anterior akinesis, but with severe    global hypokinesis and reduced ejection fraction of approximately 25% with    2+ mitral regurgitation. 2. Elevated left ventricular end diastolic pressures. 3. Severe three-vessel coronary disease (totally occluded right coronary    artery with right to left collaterals, totally occluded left  anterior    descending, severe stenosis in the left circumflex with collaterals to    the distal right coronary artery, and severe stenosis in the large    diagonal vessel of the left anterior descending system.  DISCUSSION:                   We will try to stabilize  Mr. Felch and refer him on for coronary artery bypass grafting. DD:  02/24/00 TD:  02/24/00 Job: [id number]

## 2010-11-15 NOTE — Procedures (Signed)
[place]. [facility]  Patient:    Ian Williams, Ian Williams                     MRN: [id number] Proc. Date: 03/06/00 Adm. Date:  [id number] Disc. Date: [id number] Attending:  Waldo Laine CC:         Colleen Can. Deborah Chalk, M.D.  Gwenith Daily Tyrone Sage, M.D.  Kathrine Cords, R.N.   Procedure Report  PROCEDURE PERFORMED:  Invasive electrophysiologic testing.  INDICATIONS:  Nonsustained VT in the setting of LV dysfunction in a patient with chronic ischemic heart disease.  INTRODUCTION:  The patient is a very pleasant 77 year old man who had sudden cardiac death associated with a non-Q-wave myocardial infarction.  Initially he had severely depressed LV systolic function.  His LV function subsequently improved after cardiac catheterization.  He underwent coronary artery bypass grafting by Dr. Ofilia Neas.  Postoperatively, he had some nonsustained VT and repeat echo demonstrated an EF of between 30% and 40%.  He is now referred for electrophysiologic testing to restratify him for recurrent ventricular arrhythmias.  PROCEDURE:  After informed consent was obtained the patient was taken to the diagnostic EP lab in a fasting state.  After the usual preparation and draping, intravenous fentanyl and midazolam were given for sedation.  A 5 French quadripolar catheter was inserted percutaneously in the right femoral vein and advanced to the RV apex.  A 5 French quadripolar catheter was inserted percutaneously in the right femoral vein and advanced to His bundle region.  After measurement of basic intervals, rapid ventricular pacing was carried out from the RV apex which demonstrated a VA Wenckebachs cycling from 400 msec.  Next programmed ventricular simulation was carried out from the RV apex at base drive cycle lengths of 132 and 400 msec; S1-S2, S1-S2-S3, and S1-S2-S3-S4 stimuli was delivered with the S1-S2, S2-S3, and S3-S4 in intervals stepwise decreased down to  ventricular refractions. During programming of ventricular stimulation, there was no inducible VT. Next the catheter was maneuvered to the artery outflow tract and additional program of ventricular stimulation was carried out.  Again a base drive cycle length of 440 and 400 msec;  S1-S2, S1-S2-S3, and S1-S2-S3-S4 stimuli were delivered with the S1-S2, S2-S3, and S3-S4 intervals once again decremented down to ventricular refractions.  From the RV outflow tract there was no inducible VT. Next the patient was carried off from the high right atrium with the AV Wenckebachs cycle length demonstrated to be 520 msec.  During rapid atrial pacing the PR interval remained less than the R interval.  Next programmed atrial stimulation was carried out from the high right atrium at a base drived cycle length of 600 msec.  The S1-S2 interval was stepwise decreased down to 400 msec where the ERP of the AV node was observed.  Once again there was no evidence of dual AV nodal physiology, apparent programmed atrial stimulation.  At this point the catheters were removed.  Hemostasis was served and the patient returned to his room in good condition.  COMPLICATIONS:  None.  RESULTS: 1. Baseline ECG.  The baseline 12-lead ECG demonstrates normal sinus rhythm    with nonspecific ST-T changes. 2. Baseline intervals.  The HV interval was 49 msec.  The AH interval    was 105 msec and the QT interval was 518 msec.  The sinus node cycle length    was 848 msec. 3. Rapid ventricular pacing.  Rapid ventricular pacing from the RV apex  demonstrated AV Wenckebachs cycle length of 400 msec. 4. Programmed ventricular stimulation.  Programmed ventricular stimulation    from the RV apex and RV outflow tract may suggest cycle lengths of 550 and    400 msec was carried out.  S1-S2, S1-S2-S3, and S1-S2-S3-S4 stimuli were    delivered, including long/short coupled stimuli.  During programmed    ventricular stimulation there  was no inducible VT. 5. Programmed atrial stimulation.  Programmed atrial stimulation was carried    out from the high right atrium at a base drive cycle length of 956    msec.  The S1-S2 interval stepwise decreased down to the ERP of AV    node.  There was no inducible SVT.  There was no evidence of dual AV nodal    physiology. 6. Rapid atrial pacing.  Rapid atrial pacing was carried out from the    high right atrium and was demonstrated in AV Wenckebachs cycle length    of 520 msec.  CONCLUSION:  This study demonstrates no evidence of inducible ventricular or supraventricular arrhythmias.  In addition, there is no evidence of any dual AV nodal physiology or accessory pathway conduction. DD:  03/06/00 TD:  03/08/00 Job: [id number]

## 2010-11-15 NOTE — Op Note (Signed)
[place]. [facility]  Patient:    Ian Williams, Ian Williams Visit Number: [id number] MRN: [id number]          Service Type: SUR Location: [id number] Attending Physician:  Colvin Caroli Dictated by:   Quita Skye Hart Rochester, M.D. Proc. Date: 09/29/01 Admit Date:  09/29/2001 Discharge Date: 09/30/2001                             Operative Report  PREOPERATIVE DIAGNOSIS:  Severe left internal carotid stenosis - asymptomatic.  POSTOPERATIVE DIAGNOSIS:  Severe left internal carotid stenosis - asymptomatic.  OPERATION:  Left carotid endarterectomy with Dacron patch angioplasty.  SURGEON:  Quita Skye. Hart [place], M.D.  FIRST ASSISTANT:  Larina Earthly, M.D.  SECOND ASSISTANT:  Tollie Pizza. Collins, P.A.-C.  ANESTHESIA:  General endotracheal.  BRIEF HISTORY:  This patient underwent emergency coronary artery bypass grafting by Dr. Tyrone Sage 18 months ago for an acute myocardial infarction with ventricular fibrillation preoperatively.  He has done well from a cardiac standpoint and at that time had mild carotid occlusive disease.  He now returns with a very severe left internal carotid stenosis which has progressed over the last 18 months, with very mild disease on the right side in the internal carotid and some moderate right external carotid disease.  He is scheduled for left carotid endarterectomy.  DESCRIPTION OF PROCEDURE:  The patient was taken to the operating room, placed in the supine position, at which time satisfactory general endotracheal anesthesia was administered.  The left neck was prepped with Betadine scrub and solution, draped in a routine sterile manner.  Incision was made along the anterior border of the sternocleidomastoid muscle and carried down through subcutaneous tissue and platysma using the Bovie.  The common facial vein and external jugular veins ligated with 3-0 silk ties and divided, exposing the common, internal, and external carotid  arteries.  Care was taken not to injure the vagus or hypoglossal nerves - both of which were exposed.  There was a soft plaque beginning about 1 cm distal to the bifurcation which seemed to be almost totally occlusive in nature.  The distal vessel had no palpable pulse but seemed to be free of disease.  A #10 shunt was prepared and the patient was heparinized.  The carotid vessels were occluded with vascular clamps, a longitudinal opening made in the common carotid with a 15 blade, extended up the internal carotid with the Pott scissors to a point distal to the disease. The plaque was a soft atherosclerotic plaque with no calcium and there was no evidence of any hemorrhage or ulceration but it was almost totally occlusive. The distal vessel appeared normal.  The #10 shunt was inserted without difficulty, reestablishing flow in about two minutes.  A standard endarterectomy was then performed using elevator and the Pott scissors with an eversion endarterectomy of the external carotid.  The plaque feathered off the distal internal carotid artery nicely, not requiring any tacking sutures.  The lumen was thoroughly irrigated with heparin saline and all loose debris carefully removed, and the arteriotomy was closed with a patch using continuous 6-0 Prolene.  Prior to completion of the closure, the shunt was removed after about 30 minutes of shunt time.  Following antegrade and retrograde flushing the closure was completed with reestablishment of flow initially up the external and up the internal branch.  The carotid was occluded for less than two minutes for removal of the  shunt.  Protamine was then given to reverse the heparin.  Following adequate hemostasis the wounds were irrigated with saline, closed in layers with Vicryl in a subcuticular fashion, sterile dressing applied.  The patient was taken to the recovery room in satisfactory condition. Dictated by:   Quita Skye Hart Rochester, M.D. Attending  Physician:  Colvin Caroli DD:  09/29/01 TD:  09/29/01 Job: [id number]

## 2010-11-15 NOTE — Op Note (Signed)
[place]. [facility]  Patient:    Ian Williams, Ian Williams                     MRN: [id number] Proc. Date: 02/28/00 Adm. Date:  [id number] Attending:  Waldo Laine CC:         Colleen Can. Deborah Chalk, M.D.                           Operative Report  PREOPERATIVE DIAGNOSIS:  Coronary occlusive disease with recent acute myocardial infarction and ventricular fibrillation arrest.  POSTOPERATIVE DIAGNOSIS:  Coronary occlusive disease with recent acute myocardial infarction and ventricular fibrillation arrest.  OPERATION:  Coronary artery bypass grafting times four with left internal mammary to the diagonal coronary artery, sequential reverse saphenous vein graft to the first and second obtuse marginal coronary arteries, reverse saphenous vein graft to the posterior descending coronary artery.  SURGEON:  Gwenith Daily. Tyrone Sage, M.D.  FIRST ASSISTANT:  Salvatore Decent. Dorris Fetch, M.D.  SECOND ASSISTANT:  Lissa Merlin, P.A.  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  He was stabilized medially and transferred to [facility]. He underwent cardiac catheterization by Dr. Deborah Chalk which demonstrated severe depression of LV function, mild mitral regurgitation, ejection fraction of 20%, total high grade greater than 80% stenosis of the large dominant diagonal branch the primary supplier of the anterior wall and a very small diminutive LAD which was totally occluded and filling from collaterals off the proximal right coronary artery.  He had significant disease in first and second obtuse marginals with collateral filling to the distal right coronary artery.  The right coronary artery was totally occluded in the mid portion.  The patient was stabilized medically.  Follow up  echocardiogram revealed two days after the insult, some improvement in his LV function and decrease in the detail of mitral regurgitation.  Coronary artery bypass grafting was recommended to the patient because of the severe three vessel disease and depressed LV function. The patient was agreeable and signed informed consent.  DESCRIPTION OF PROCEDURE:  With Swan-Ganz and arterial line monitors placed, the patient underwent general endotracheal anesthesia without incident.  With placement of Swan-Ganz catheter his PA pressures were noted to be markedly elevated.  Transesophageal echocardiogram probe was placed and showed very trivial mitral regurgitation, depressed LV function but slightly better than estimated from the cardiac catheterization ventriculogram.  The patient was prepped and draped in the usual sterile manner.  Vein was harvested from the right lower extremity and was of good quality and caliber.  Median sternotomy was performed.  The left internal mammary artery was dissected down as a pedicle graft.  The distal artery was divided and had good free flow.  The pericardium was opened.  The patient had very significant enlargement of the heart, particularly the right ventricle with probable volume overload.  He was systemically heparinized.  The ascending aorta and the right atrium were cannulated and the aortic root bent cardioplegia needle was introduced into the ascending aorta.  The patient was placed on cardiopulmonary bypass 2.4 liters per minute per square meter.  Sites of anastomoses were selected and dissected out of the epicardium.  The patients body temperature was cooled to 28 degrees.  Aortic cross clamp was applied, 500 cc of cold  blood cardioplegia was administered with rapid diastolic arrest of the heart.  Myocardial septal temperature was monitored throughout the cross clamp.  Attention was turned first to the OM1 vessel which was opened. It was reasonable size  vessel admitting a 1.5 mm probe.  Using a diamond type side to side anastomosis was carried out with a second reverse saphenous vein graft.  Distal extent of the same vein was then carried short distance to the second obtuse marginal coronary artery which was opened and was slightly small and approximately 1.3 to 1.4 mm in size.  Using a running 7-0 Prolene distal anastomosis was performed.  Attention was then turned to the posterior descending coronary artery which was opened and admitted a 1 mm probe.  The vessel was approximately 1.3 to 1.4 mm in size.  Using a running 7-0 Prolene distal anastomosis was performed.  Additional cold blood cardioplegia was administered down the vein grafts.  The left anterior descending coronary artery was endomyocardial and was dissected out of the myocardium and located but the vessel was diffusely diseased and less than 1 mm in size and it was decided it would be more appropriate to place the mammary artery to the large diagonal vessel that was the primary supplier of the anterior wall.  This vessel was opened and admitted a 1.5 mm probe down both of the major branches. Using a running 8-0 Prolene the left internal mammary artery was anastomosed to the diagonal coronary artery.  With release of the Bulldog there was appropriate rise in myocardial septal temperature and aortic cross clamp was removed. Total cross clamp time was 73 minutes.  The patient spontaneously converted to a sinus rhythm.  Partial occlusion clamp was placed on the ascending aorta.  Two punch aortotomies were performed.  Each of the two vein grafts were anastomosed to the ascending aorta.  Air was evacuated from the grafts.  Partial occlusion clamp was removed.  The patient was placed on dopamine and milrinone infusions.  He was then ventilated and weaned from cardiopulmonary bypass and remained hemodynamically stable.  While on bypass hemoconcentrator had been used and approximately  3.5 liters of fluid were removed.  Following bypass the patients right heart was much smaller in size with improved LV function.  He remained hemodynamically stable.  He was  decannulated in the usual fashion.  Protamine sulfate was administered with operative field hemostatic.  Two atrial and two ventricular pacing wires were applied.  Graft markers were applied.  Left pleural tube and two mediastinal tubes were left in place.  The sternum was closed with #6 stainless wire.  The fascia was closed with interrupted 0 Vicryl, running 3-0 Vicryl in subcutaneous tissue and 4-0 subcuticular stitch in skin edges.  Dry dressings were applied.  Sponge and needle counts were reported as correct at the completion of the procedure.  The patient tolerated the procedure without obvious complication and was transferred to surgical intensive care unit for further postoperative care. DD:  03/04/00 TD:  03/04/00 Job: [id number]

## 2010-11-15 NOTE — Op Note (Signed)
[facility]  Patient:    Ian Williams, Ian Williams                     MRN: [id number] Proc. Date: 01/05/01 Adm. Date:  [id number] Attending:  Nelda Marseille CC:         Rodrigo Ran, M.D.  Colleen Can. Deborah Chalk, M.D.   Operative Report  PROCEDURE:  Colonoscopy with polypectomy.  ENDOSCOPIST:  Petra Kuba, M.D.  INDICATIONS:  Bright red blood per rectum, family history of colon cancer, due for colonic screening.  Consent was signed after risks, benefits, methods, and options were thoroughly discussed in the office.  MEDICINES USED:  Demerol 70, Versed 6.  DESCRIPTION OF PROCEDURE:  Rectal inspection was pertinent for external hemorrhoids.  Digital exam was negative.  Video colonoscope was inserted and easily advanced around the colon to the cecum.  This did not require any abdominal pressure or any position changes.  The cecum was identified by the appendiceal orifice and the ileocecal valve.  The scope was slowly withdrawn. The prep was adequate.  There was some liquid stool adherent to the wall which required lots of washing and suctioning, but on slow withdrawal through the colon, the cecum, ascending, and majority of the transverse were normal.  As the scope was withdrawn around the splenic flexure, multiple tiny, hyperplastic-appearing polyps were seen, but they were all hot biopsied x 1 or 2.  There was an occasional early left-sided diverticula.  No other abnormalities were seen as we slowly withdrew back to the rectum.  Once back in the rectum, the scope was retroflexed, pertinent for some internal hemorrhoids.  The scope was then straightened and readvanced around the left side of the colon.  Air was suctioned and scope removed.  The patient tolerated the procedure well.  There was no obvious immediate complication.  ENDOSCOPIC DIAGNOSIS: 1. Internal and external hemorrhoids. 2. Occasional left-sided diverticula. 3. Multiple tiny questionable  hyperplastic-appearing left-sided polyps,    hot biopsied. 4. Otherwise within normal limits to the cecum.  PLAN:  Await pathology to determine future colonic screening.  Customary 10-day post polypectomy instruction.  GI followup p.r.n., otherwise return care to Dr. Waynard Edwards and Dr. Deborah Chalk to include yearly rectals and guaiacs. DD:  01/05/01 TD:  01/05/01 Job: [id number]

## 2010-11-15 NOTE — Consult Note (Signed)
[place]. [facility]  Patient:    Ian Williams, Ian Williams                    MRN: [id number] Proc. Date: 02/24/00 Attending:  Gwenith Daily. Tyrone Sage, M.D.                          Consultation Report  REASON FOR CONSULTATION:  Ventricular fibrillation arrest with acute myocardial infarction.  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  He was noted to be unresponsive and pulseless.  Prior to the EMTs arriving, he had 10 minutes of CPR and was ultimately defibrillated x 3 with epinephrine, atropine, lidocaine, and bicarbonate given.  The patient was briefly intubated and was transferred to [facility]  He was taken directly to the catheterization lab by Dr. Deborah Chalk on arrival on February 24, 2000.  Cardiac catheterization revealed total occlusion of the right coronary artery with collateral filling from the acute marginal to the LAD, an 80% first obtuse marginal, and then a 90% circumflex lesion before two smaller second and third obtuse marginals with collaterals to the right system.  The left main was intact.  There is a large first diagonal with an 80% lesion.  The LAD is totally occluded after the first diagonal.  The patients ejection fraction is noted to be approximately 20%. Filling pressures are not noted in the chart.  A catheter was placed in the mitral apparatus, but there is evidence of mitral regurgitation but difficult to gauge.  The patient was stabilized on dopamine, dobutamine, and amiodarone and referred for consideration of bypass surgery.  Prior to this event, the patient denies any angina, denies any shortness of breath, denies any recent increase in fatigue.  Specifically, he has had no previous myocardial infarctions that he is aware of.  He denies diabetes.  He has been a smoker but quit on  admission.  PAST MEDICAL HISTORY: 1. Hypertension. 2. History of renal stones, status post nephrolithotomy. 3. Status post hernia repairs. 4. Patient denies any thyroid or diabetes.  ALLERGIES:  PENICILLIN, which has caused a rash.  He has a question of allergy to SULFA.  He is unsure of the details but thinks that many years ago he developed a rash with this.  MEDICATIONS:  Hydrochlorothiazide, aspirin, K-Dur, clonidine, and atenolol.  FAMILY HISTORY:  The patients father died of a CVA.  Mother died from cancer.  The patient has one son who in his early 12s without known coronary occlusive disease suffered a ventricular fibrillation arrest and now has a defibrillator in place.  SOCIAL HISTORY:  The patient is married, lives with his wife, has 10 children, smokes one pack of cigarettes per day, has two alcoholic drinks a day.  REVIEW OF SYSTEMS:  The patient is awake and alert and able to review his systems.  He has had no constitutional symptoms such as weight loss, nausea, fever, or chills.  He denies any previous syncopal episodes until the one that precipitated his admission.  He has had no history of seizures.  He denies any shortness of breath, denies hemoptysis.  He denies any hematuria recently but has had hematuria many years ago related to kidney stones.  He denies any blood in his stool.  He denies any abdominal pain.  He does note pain in  his left upper leg with ambulation.  He attributes this to injury as a child to the left upper leg.  He denies any previous psychiatric history.  He denies any skin disease other than rash from penicillin.  The other review of systems is negative.  PHYSICAL EXAMINATION:  GENERAL:  The patient is awake and alert on dopamine, dobutamine, amiodarone infusions in the CCU, 3 mcg/kg of dopamine and 5 mcg/kg per minute of dobutamine.  As noted, the patient is awake and alert and able to relate his history.  VITAL SIGNS:  Blood pressure  is 100/70, sinus rhythm is 70, O2 saturations on 2 L are 98%.  HEENT:  Pupils are equal, round and reactive to light.  NECK:  Without carotid bruits.  He has no jugular venous distention.  SKIN:  Warm and dry without clamminess.  LUNGS:  Coarse rhonchi bilaterally but without wheezes.  CARDIAC:  Regular rate and rhythm.  I do not appreciate any murmur of mitral insufficiency on exam at this time.  His PMI is displaced laterally.  ABDOMEN:  Soft without palpable organomegaly or palpable dilatation of the aorta.  EXTREMITIES:  Palpable DP and PT pulses on the right.  He has very faint DP pulse on the left and no posterior tibial pulse.  LABORATORY DATA:  On admission, the patients hematocrit was 43.  Potassium 3.4, creatinine 1.7.  CK-MBs initially were 1060 with an MB of 28.9. Subsequent study revealed a total of 3500 and an MB of 64.5.  IMPRESSION: 1. Out-of-hospital ventricular fibrillation arrest with associated myocardial    infarction and three-vessel coronary artery disease. 2. Renal insufficiency, creatinine 1.7, unclear of the baseline. 3. Currently requiring pressor support. 4. Severe ischemic cardiomyopathy. 5. Ongoing tobacco abuse. 6. Question ischemic mitral regurgitation.  PLAN:  The patient currently has been stabilized medically, requiring pressors and amiodarone, without further arrhythmias or evidence of congestive heart failure.  On reviewing the films, the patient does need coronary artery bypass grafting.  The issue will be the timing of surgery so as to minimize his risk, which is obviously elevated because of his severe LV dysfunction.  Prior to surgery, the patient will also need an echocardiogram to help evaluate the need for coronary artery bypass surgery alone or in addition to mitral valve ring.  Will plan to continue to follow the patient before making a final determination about the timing of surgery. DD:  02/25/00 TD:  02/26/00 Job:  [id number]

## 2010-11-15 NOTE — Discharge Summary (Signed)
[place]  Patient:    Ian Williams, Ian Williams                     MRN: [id number] Adm. Date:  [id number] Disc. Date: [id number] Attending:  Waldo Laine Dictator:   Carlye Grippe. CC:         Colleen Can. Deborah Chalk, M.D.  Nathen May, M.D., [facility]  Rodrigo Ran, M.D.   Discharge Summary  DATE OF BIRTH:  06/17/1931  HISTORY OF PRESENT ILLNESS:  This is a 77 year old male who presented for hospitalization following a ventricular fibrillation arrest approximately two to three hours prior to admission.  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  He was treated initially prior to EMS with cardiopulmonary resuscitation and three shocks.  Additionally, he was given epinephrine, atropine, lidocaine, and bicarbonate, and was defibrillated a fourth time returning to a sinus tachycardia.  He was briefly intubated and started on intravenous lidocaine and was transferred for further definitive care at [facility] by Dr. Roger Shelter.  Patient did state he did have an episode of chest pain on the date of admission when walking up steps.  PAST MEDICAL HISTORY: 1. Hypertension. 2. History of kidney stones status post lithotripsy and nephrolithotomy. 3. Status post hernia surgery x 2.  ALLERGIES:  PENICILLIN and SULFA.  MEDICATIONS ON ADMISSION: 1. Atenolol. 2. K-Dur. 3. Clonidine. 4. Hydrochlorothiazide. 5. Aspirin.  FAMILY HISTORY:  Please see the history and physical done at the time of admission.  SOCIAL HISTORY:  Please see the history and physical done at the time of admission.  REVIEW OF SYSTEMS:  Please see the history and physical done at the time of admission.  PHYSICAL EXAMINATION:  Please see the history and physical done at the time  of admission.  HOSPITAL COURSE:  The patient was admitted with impression of cardiac arrest with prior chest pain for further evaluation to be taken acutely to the cardiac catheterization lab.  He was started on intravenous amiodarone, laboratories were checked, and CK, troponin, enzymes were negative.  In the CATH lab, on February 24, 2000, he was found to have severe left ventricular dysfunction with inferior and anterior akinesia, an ejection fraction of approximately 25%, also evidence of 2+ mitral regurgitation.  The right coronary artery had 100% stenosis, the left circumflex had an 80-90% first obtuse marginal lesion, then a 90% at the left circumflex before the second and third obtuse marginals.  There were collaterals to the right coronary artery.  The left main coronary artery was felt to be normal.  The LAD had a 100% lesion just after the first diagonal and there was also a first diagonal diffusely diseased artery with an 80-90% segmental disease.  The patient was stabilized with pressors and antiarrhythmics and cardiac surgical opinion was obtained with Sheliah Plane, M.D., who evaluated the patient and his studies.  Impression included out-of-hospital ventricular fibrillation arrest with associated myocardial infarction and three-vessel coronary artery disease.  Renal insufficiency was also noted with a creatinine of 1.7 with an unclear knowledge of his baseline creatinine.  His plan after initial evaluation was to continue stabilizing medically with pressors and amiodarone and then follow up with coronary artery bypass grafting after a period of medical stabilization.  The patient continued to show improvement on  intravenous dopamine, dobutamine, and amiodarone.  The patient did have an episode of elevated white blood cell count and respiratory infection was felt to be a possibility as Gram stain of sputum showed abundant white blood cells and gram-positive cocci in  clusters and pairs.  He was initially started on a course of Rocephin prior to any surgical intervention.  On February 28, 2000, the patient was deemed to be medically stable for proceeding with the surgical procedure and the patient underwent the following procedure:  Coronary artery bypass grafting x 4.  The following grafts were placed:  (1) Left internal mammary artery to the diagonal.  (2) A sequential saphenous vein graft to obtuse marginals 1 and 2.  (3) A saphenous vein graft to the posterior descending.  Cross-clamp time was 73 minutes.  Pump time was 127 minutes.  Intraoperative findings included that the left internal mammary artery was to a large dominant diagonal on the anterior wall.  The LAD was noted to be less than 1 mm and intramyocardial, not suitable for grafting. There was significant right heart enlargement noted prior to surgery but transesophageal echocardiogram revealed minimal mitral regurgitation and patient was not felt to require mitral valve repair or replacement.  Postoperative hospital course:  Patient has done well.  He was extubated, awake, alert, and neurologically intact.  He initially did require continuation of pressors including milrinone and dopamine at a low dose. Initially, amiodarone was continued.  These were eventually weaned off without difficulty.  The patient required aggressive pulmonary toilet but has responded well.  Initially, he did also have some hypertension but has returned to acceptable parameters on his current regimen.  He has also been started on Altace for his severe left ventricular dysfunction.  He additionally has responded well to diuresis.  The patient also was kept on a course of Rocephin during the postoperative period for seven days as he did have his preoperative presumed respiratory infection with possibility of aspiration from his arrest noted.  Over the next several days, the patient has  shown a good and gradual  improvement.  His amiodarone was initially changed p.o. and a cardiology electrophysiology consultation was obtained for possible EP testing.  Dr. Graciela Husbands initially saw the patient and the procedure was performed by Dr. Sharrell Ku on March 06, 2000, with findings showing no inducible ventricular tachycardia or supraventricular tachycardia.  There was additionally no dual AV nodal physiology or AP conduction.  He recommended continuation of medical therapies for chronic heart disease with left ventricular dysfunction.  Currently, the patient is feeling well.  He has been weaned from oxygen and maintains good saturations on room air.  He has responded well to diuresis but continues to require a maintenance Lasix dosage.  His incisions are healing well without signs of infection.  His physical examination is felt to be quite stable.  He has shown no current evidence of congestive heart failure.  His rehabilitation has been good in regards to his toleration of cardiac rehabilitation phase I modalities and he is independent at this point in regard to routine activities.  He is currently in a normal sinus rhythm with no significant cardiac dysrhythmias or ectopy.  His overall status is felt to be quite acceptable for tentative discharge the morning of March 07, 2000 pending morning round reevaluation.  MEDICATIONS ON DISCHARGE: 1. Altace 5 mg b.i.d. 2. Catapres 0.3 mg p.o. q.d. 3. Lasix 40 mg p.o. q.d. 4. Potassium chloride 20 mEq p.o. q.d. 5. Aspirin one 325  mg tablet p.o. q.d. 6. Lopressor 25 mg q.12h. 7. Enteric-coated aspirin 325 mg q.d. 8. Tylox one to two q.4-6h. p.r.n.  FOLLOW-UP: 1. Dr. Tyrone Sage in three weeks. 2. Dr. Deborah Chalk in two weeks.  FINAL DIAGNOSIS:  Severe multi-vessel coronary artery disease with severe left ventricular dysfunction with presentation including ventricular fibrillation cardiac arrest.  OTHER DIAGNOSES: 1. Hypertension. 2. History of kidney  stones. 3. History of hernia surgeries x 2. 4. History of tobacco abuse. 5. Preoperative renal insufficiency. 6. Preoperative bronchitis.  DISCHARGE LABORATORIES:  Most recent laboratories are felt to be stable.  His most recent hemoglobin and hematocrit dated March 02, 2000 are 10 and 30, respectively.  Electrolytes, BUN, and creatinine have all been stabilized; the most recent creatinine is 1.1 on March 02, 2000.  CONDITION ON DISCHARGE:  Stable and improving.  INSTRUCTIONS:  The patient will receive written instructions in regard to medications, activity, diet, wound care, and follow up. DD:  03/06/00 TD:  03/08/00 Job: [id number]

## 2010-12-06 ENCOUNTER — Ambulatory Visit (INDEPENDENT_AMBULATORY_CARE_PROVIDER_SITE_OTHER): Payer: Medicare Other | Admitting: *Deleted

## 2010-12-06 DIAGNOSIS — I4891 Unspecified atrial fibrillation: Secondary | ICD-10-CM

## 2010-12-06 LAB — POCT INR: INR: 2.3

## 2010-12-09 ENCOUNTER — Telehealth: Payer: Self-pay | Admitting: Cardiology

## 2010-12-09 NOTE — Telephone Encounter (Signed)
Called in today because he was concerned about the weight he was gaining taking Lasix.  Said that [place] told him that he should call in if he gained 2 or more pounds a day. He was on two pills a day and then his prescription was lowered to one pill a day. Now he's gaining weight and his anckles are beginning to swell. Please call back.

## 2010-12-09 NOTE — Telephone Encounter (Addendum)
Pt called c/o weight up 2-3 lbs over the last few days.  Norma Fredrickson NP notified and instructed RN to have pt take an extra Lasix 80 mg and KDur 20 meq tonight and to call back if weigh does not return to normal.  Pt notified and verbalized to RN understanding of instructions.

## 2011-01-03 ENCOUNTER — Ambulatory Visit (INDEPENDENT_AMBULATORY_CARE_PROVIDER_SITE_OTHER): Payer: Medicare Other | Admitting: *Deleted

## 2011-01-03 DIAGNOSIS — I4891 Unspecified atrial fibrillation: Secondary | ICD-10-CM

## 2011-01-03 LAB — POCT INR: INR: 2.6

## 2011-01-07 LAB — POCT INR: INR: 2.6

## 2011-01-09 ENCOUNTER — Other Ambulatory Visit: Payer: Self-pay | Admitting: *Deleted

## 2011-01-09 MED ORDER — METOPROLOL TARTRATE 100 MG PO TABS: 100.0000 mg | ORAL_TABLET | Freq: Two times a day (BID) | ORAL | Status: DC

## 2011-01-13 ENCOUNTER — Other Ambulatory Visit (INDEPENDENT_AMBULATORY_CARE_PROVIDER_SITE_OTHER): Payer: Medicare Other | Admitting: *Deleted

## 2011-01-13 DIAGNOSIS — Z79899 Other long term (current) drug therapy: Secondary | ICD-10-CM

## 2011-01-13 LAB — HEPATIC FUNCTION PANEL
AST: 33 U/L (ref 0–37)
Albumin: 3.8 g/dL (ref 3.5–5.2)
Alkaline Phosphatase: 153 U/L — ABNORMAL HIGH (ref 39–117)
Bilirubin, Direct: 0.4 mg/dL — ABNORMAL HIGH (ref 0.0–0.3)
Total Bilirubin: 1.1 mg/dL (ref 0.3–1.2)

## 2011-01-14 ENCOUNTER — Ambulatory Visit (INDEPENDENT_AMBULATORY_CARE_PROVIDER_SITE_OTHER): Payer: Medicare Other | Admitting: *Deleted

## 2011-01-14 ENCOUNTER — Encounter: Payer: Self-pay | Admitting: *Deleted

## 2011-01-14 ENCOUNTER — Ambulatory Visit (INDEPENDENT_AMBULATORY_CARE_PROVIDER_SITE_OTHER): Payer: Medicare Other | Admitting: Internal Medicine

## 2011-01-14 ENCOUNTER — Encounter: Payer: Self-pay | Admitting: Internal Medicine

## 2011-01-14 DIAGNOSIS — I255 Ischemic cardiomyopathy: Secondary | ICD-10-CM

## 2011-01-14 DIAGNOSIS — Z95810 Presence of automatic (implantable) cardiac defibrillator: Secondary | ICD-10-CM

## 2011-01-14 DIAGNOSIS — I472 Ventricular tachycardia, unspecified: Secondary | ICD-10-CM

## 2011-01-14 DIAGNOSIS — I5022 Chronic systolic (congestive) heart failure: Secondary | ICD-10-CM

## 2011-01-14 DIAGNOSIS — I4891 Unspecified atrial fibrillation: Secondary | ICD-10-CM

## 2011-01-14 DIAGNOSIS — I5023 Acute on chronic systolic (congestive) heart failure: Secondary | ICD-10-CM

## 2011-01-14 DIAGNOSIS — I2589 Other forms of chronic ischemic heart disease: Secondary | ICD-10-CM

## 2011-01-14 DIAGNOSIS — I10 Essential (primary) hypertension: Secondary | ICD-10-CM

## 2011-01-14 LAB — ICD DEVICE OBSERVATION
AL IMPEDENCE ICD: 513 Ohm
BAMS-0001: 170 {beats}/min
CHARGE TIME: 8.478 s
FVT: 0
PACEART VT: 0
TOT-0001: 0
TOT-0002: 0
TZAT-0001ATACH: 2
TZAT-0001ATACH: 3
TZAT-0001FASTVT: 1
TZAT-0001SLOWVT: 1
TZAT-0002FASTVT: NEGATIVE
TZAT-0004SLOWVT: 8
TZAT-0005SLOWVT: 88 pct
TZAT-0012ATACH: 150 ms
TZAT-0012SLOWVT: 200 ms
TZAT-0013SLOWVT: 3
TZAT-0018ATACH: NEGATIVE
TZAT-0018SLOWVT: NEGATIVE
TZAT-0019ATACH: 6 V
TZAT-0019FASTVT: 8 V
TZAT-0020ATACH: 1.5 ms
TZAT-0020ATACH: 1.5 ms
TZAT-0020ATACH: 1.5 ms
TZON-0003ATACH: 350 ms
TZON-0003SLOWVT: 350 ms
TZON-0003VSLOWVT: 400 ms
TZON-0004SLOWVT: 32
TZON-0004VSLOWVT: 20
TZON-0005SLOWVT: 12
TZST-0001ATACH: 6
TZST-0001FASTVT: 2
TZST-0001FASTVT: 3
TZST-0001FASTVT: 4
TZST-0001FASTVT: 6
TZST-0001SLOWVT: 2
TZST-0001SLOWVT: 3
TZST-0001SLOWVT: 5
TZST-0002ATACH: NEGATIVE
TZST-0002FASTVT: NEGATIVE
TZST-0002FASTVT: NEGATIVE
TZST-0002FASTVT: NEGATIVE
TZST-0003SLOWVT: 35 J
TZST-0003SLOWVT: 35 J
TZST-0003SLOWVT: 35 J
VENTRICULAR PACING ICD: 80.41 pct

## 2011-01-14 LAB — BASIC METABOLIC PANEL
Calcium: 8.5 mg/dL (ref 8.4–10.5)
GFR: 52.1 mL/min — ABNORMAL LOW (ref >60.00)
Potassium: 3.9 mEq/L (ref 3.5–5.1)
Sodium: 139 mEq/L (ref 135–145)

## 2011-01-14 LAB — POCT INR: INR: 2.6

## 2011-01-14 NOTE — Assessment & Plan Note (Signed)
Continue current medications pending cardioversion;

## 2011-01-14 NOTE — Assessment & Plan Note (Signed)
The pressure is well controlled. He takes a whole bunch of medicines for this. We will plan to decrease his amlodipine. This may help his edema.

## 2011-01-14 NOTE — Assessment & Plan Note (Signed)
I anticipate that this is at least in part related to atrial fibrillation. There is evidence of volume overload. I don't know whether his peripheral edema is just amlodipine but I suspect not. Hopefully the restoration of sinus rhythm will allow for improvement in cardiac performance

## 2011-01-14 NOTE — Patient Instructions (Signed)
Your physician recommends that you have lab work today: bmp/magnesium/digoxin (427.31).  Your physician has recommended you make the following change in your medication:  1) Decrease amlodipine to 5mg  once daily.  Your physician has recommended that you have a Cardioversion (DCCV). Electrical Cardioversion uses a jolt of electricity to your heart either through paddles or wired patches attached to your chest. This is a controlled, usually prescheduled, procedure. Defibrillation is done under light anesthesia in the hospital, and you usually go home the day of the procedure. This is done to get your heart back into a normal rhythm. You are not awake for the procedure. Please see the instruction sheet given to you today.  Your physician recommends that you schedule a follow-up appointment in: 2 weeks.

## 2011-01-14 NOTE — Assessment & Plan Note (Signed)
The patient's device was interrogated.  The information was reviewed. No changes were made in the programming.    

## 2011-01-14 NOTE — Progress Notes (Signed)
HPI  Ian Williams is a 77 y.o. male seen in followup the floor ICD implanted for primary prevention in the setting of ischemic heart disease progressive left ventricular dysfunction and prior bypass.  His device implantation was complicated by ventricular tachycardia. which developed after ICD implantation and for which he was appropriately shocked. he then had other episodes of ventricular tachycardia . He also was cardioverted from what was thought to be permanent atrial fibrillation to sinus rhythm. He then underwent device revision with repositioning of the right ventricular lead because of the concern for pacing lead induced arrhythmia. He also received an atrial lead at that time because of the reversion to sinus rhythm.  Was treated concurrently with amiodarone which was initially associated with nausea and lightheadedness. These have abated markedly with down titration.  At the time of his upgrade he also received An LV lead. However, He noted that his breathing wasconsiderably better prior to CRT implantation; His LV lead was turned off and he feels a good deal better  Over the last month or 2 however he has been having increasing fatigue and tendency to his sleeping. He has also noted some increase in peripheral edema.     Past Medical History  Diagnosis Date  . Coronary artery disease 02/28/00    CABG  . Chronic atrial fibrillation   . Peripheral vascular disease     Iliac disease and mesenteric disease  . Carotid arterial disease     Left Carotid Endartectomy  . Cardiac arrest 02/24/2000  . Hypertension   . Long term current use of anticoagulant   . History of kidney stones   . Hyperlipidemia   . Single ICD (implantable cardiac defibrillator) in place 04/2010    Revision 06/2010  . History of echocardiogram 01/2010    8/11 Echo: EF=20-25% with septal akinesia and global hypokinesia and and LV systolic dysfunction.10/08 Echo: EF=35-40% with  LV dysfunction  . History of  cardiovascular stress test 9/09    9/09 Nuclear: EF=47%,inferoseptal hypokinesia and scar consistent with  old infarction: No significant sichemia.  No change from 07 per Dr. Deborah Chalk    Past Surgical History  Procedure Date  . Coronary artery bypass graft 2001    X4  . Cardiac defibrillator placement     ICD 04/2010  . 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  Totally occluded native cisrculation with some proximal conus collaterals to a small left anterior desending. Patent grafts.    Current Outpatient Prescriptions  Medication Sig Dispense Refill  . acetaminophen (TYLENOL) 325 MG tablet Take 650 mg by mouth as needed.        Marland Kitchen allopurinol (ZYLOPRIM) 300 MG tablet Take 300 mg by mouth daily.        Marland Kitchen amiodarone (PACERONE) 200 MG tablet Take 200 mg by mouth daily.        Marland Kitchen amLODipine (NORVASC) 10 MG tablet Take 10 mg by mouth daily.        . cloNIDine (CATAPRES) 0.3 MG tablet Take 0.3 mg by mouth daily.        . digoxin (LANOXIN) 0.125 MG tablet Take 125 mcg by mouth daily.        Marland Kitchen ezetimibe (ZETIA) 10 MG tablet Take 10 mg by mouth daily. 1/2 tablet       . famotidine (PEPCID) 20 MG tablet Take 20 mg by mouth daily.        . furosemide (LASIX) 80 MG tablet Take  80 mg by mouth 2 (two) times daily.       Marland Kitchen levothyroxine (SYNTHROID, LEVOTHROID) 75 MCG tablet Take 88 mcg by mouth daily.       . metoprolol (LOPRESSOR) 100 MG tablet Take 1 tablet (100 mg total) by mouth 2 (two) times daily.  60 tablet  9  . Multiple Vitamins-Minerals ([employer] SPECTRAVITE ADVANCED PO) Take 1 tablet by mouth daily.        . Multiple Vitamins-Minerals (OCUVITE ADULT 50+) CAPS Take 1 capsule by mouth 2 (two) times daily.        . niacin (NIASPAN) 500 MG CR tablet Take 500 mg by mouth at bedtime.        . Omega-3 Fatty Acids (FISH OIL) 1000 MG CAPS Take 1 capsule by mouth 2 (two) times daily.        . potassium chloride SA (K-DUR,KLOR-CON) 20 MEQ tablet Take 20 mEq by mouth 3  (three) times daily.       . ramipril (ALTACE) 5 MG capsule Take 5 mg by mouth 2 (two) times daily.        Marland Kitchen warfarin (COUMADIN) 5 MG tablet Take 1 tablet (5 mg total) by mouth daily. UAD   45 tablet  3    Allergies  Allergen Reactions  . Penicillins   . Sulfonamide Derivatives     Review of Systems negative except from HPI and PMH  Physical Exam Well developed and well nourished in no acute distress HENT normal E scleral and icterus clear Neck Supple JVP 7-8 cm; carotids brisk and full Clear to ausculation Regular rate and rhythm,2/6 murmur at the left lower sternal border Soft with active bowel sounds No clubbing cyanosis 1-2+ edema bilaterallyAlert and oriented, grossly normal motor and sensory function Skin Warm and Dry    Assessment and  Plan

## 2011-01-14 NOTE — Assessment & Plan Note (Addendum)
The patient has developed atrial  fibrillation again in early June. We will check his INRs and anticipate cardioversion. For now we will also measure his digoxin level and anticipate continuing his amiodarone

## 2011-01-15 ENCOUNTER — Telehealth: Payer: Self-pay | Admitting: Internal Medicine

## 2011-01-15 DIAGNOSIS — I4891 Unspecified atrial fibrillation: Secondary | ICD-10-CM

## 2011-01-15 LAB — DIGOXIN LEVEL: Digoxin Level: 1.6 ng/mL (ref 0.8–2.0)

## 2011-01-15 NOTE — Telephone Encounter (Signed)
The patient's digoxin level came back elevated. He has been made aware per Dr. Graciela Husbands, to hold digoxin for 1 week then resume at 1/2 tablet daily.

## 2011-01-15 NOTE — Telephone Encounter (Signed)
I spoke with  Ian Williams, she will move the patient up to 10am for 01/16/11. The patient is aware. The patient is aware of his bmp results as well. He is aware to decrease lasix to 80mg  once daily. We will repeat his bmp on 01/29/11.

## 2011-01-15 NOTE — Telephone Encounter (Signed)
Per Ian Williams from [facility] scheduling dept call, pt has appt Thursday, July 19th at 11am; there is availability for appt to be moved up one hour to 10 am. Please call pt and see if she would like to move appt time up one hour.

## 2011-01-16 ENCOUNTER — Ambulatory Visit (HOSPITAL_COMMUNITY)
Admission: RE | Admit: 2011-01-16 | Discharge: 2011-01-16 | Disposition: A | Discharge status: Home / Self Care | Payer: Medicare Other | Source: Ambulatory Visit | Attending: Internal Medicine | Admitting: Internal Medicine

## 2011-01-16 ENCOUNTER — Encounter: Payer: Medicare Other | Admitting: *Deleted

## 2011-01-16 DIAGNOSIS — I4891 Unspecified atrial fibrillation: Secondary | ICD-10-CM | POA: Insufficient documentation

## 2011-01-16 DIAGNOSIS — Z01812 Encounter for preprocedural laboratory examination: Secondary | ICD-10-CM | POA: Insufficient documentation

## 2011-01-16 DIAGNOSIS — I10 Essential (primary) hypertension: Secondary | ICD-10-CM | POA: Insufficient documentation

## 2011-01-16 DIAGNOSIS — I2589 Other forms of chronic ischemic heart disease: Secondary | ICD-10-CM | POA: Insufficient documentation

## 2011-01-16 DIAGNOSIS — Z01810 Encounter for preprocedural cardiovascular examination: Secondary | ICD-10-CM | POA: Insufficient documentation

## 2011-01-16 DIAGNOSIS — K551 Chronic vascular disorders of intestine: Secondary | ICD-10-CM | POA: Insufficient documentation

## 2011-01-16 DIAGNOSIS — I5022 Chronic systolic (congestive) heart failure: Secondary | ICD-10-CM | POA: Insufficient documentation

## 2011-01-16 DIAGNOSIS — Z7901 Long term (current) use of anticoagulants: Secondary | ICD-10-CM | POA: Insufficient documentation

## 2011-01-16 DIAGNOSIS — Z95810 Presence of automatic (implantable) cardiac defibrillator: Secondary | ICD-10-CM | POA: Insufficient documentation

## 2011-01-16 DIAGNOSIS — I708 Atherosclerosis of other arteries: Secondary | ICD-10-CM | POA: Insufficient documentation

## 2011-01-16 DIAGNOSIS — Z79899 Other long term (current) drug therapy: Secondary | ICD-10-CM | POA: Insufficient documentation

## 2011-01-16 DIAGNOSIS — I2510 Atherosclerotic heart disease of native coronary artery without angina pectoris: Secondary | ICD-10-CM | POA: Insufficient documentation

## 2011-01-16 DIAGNOSIS — Z951 Presence of aortocoronary bypass graft: Secondary | ICD-10-CM | POA: Insufficient documentation

## 2011-01-16 LAB — CBC
Platelets: 159 10*3/uL (ref 150–400)
RBC: 3.84 MIL/uL — ABNORMAL LOW (ref 4.22–5.81)
RDW: 14.7 % (ref 11.5–15.5)
WBC: 6.3 10*3/uL (ref 4.0–10.5)

## 2011-01-16 LAB — GLUCOSE, CAPILLARY: Glucose-Capillary: 113 mg/dL — ABNORMAL HIGH (ref 70–99)

## 2011-01-17 ENCOUNTER — Telehealth: Payer: Self-pay | Admitting: Cardiology

## 2011-01-17 DIAGNOSIS — E785 Hyperlipidemia, unspecified: Secondary | ICD-10-CM

## 2011-01-17 NOTE — Telephone Encounter (Signed)
Order for liver function profile put in.

## 2011-01-20 ENCOUNTER — Encounter: Payer: Self-pay | Admitting: Internal Medicine

## 2011-01-20 ENCOUNTER — Telehealth: Payer: Self-pay | Admitting: Internal Medicine

## 2011-01-20 ENCOUNTER — Ambulatory Visit (INDEPENDENT_AMBULATORY_CARE_PROVIDER_SITE_OTHER): Payer: Medicare Other | Admitting: Internal Medicine

## 2011-01-20 DIAGNOSIS — R0602 Shortness of breath: Secondary | ICD-10-CM

## 2011-01-20 DIAGNOSIS — I2589 Other forms of chronic ischemic heart disease: Secondary | ICD-10-CM

## 2011-01-20 DIAGNOSIS — I4891 Unspecified atrial fibrillation: Secondary | ICD-10-CM

## 2011-01-20 DIAGNOSIS — I255 Ischemic cardiomyopathy: Secondary | ICD-10-CM

## 2011-01-20 DIAGNOSIS — I5023 Acute on chronic systolic (congestive) heart failure: Secondary | ICD-10-CM

## 2011-01-20 LAB — BASIC METABOLIC PANEL
CO2: 26 mEq/L (ref 19–32)
Chloride: 95 mEq/L — ABNORMAL LOW (ref 96–112)
Potassium: 4.6 mEq/L (ref 3.5–5.1)
Sodium: 132 mEq/L — ABNORMAL LOW (ref 135–145)

## 2011-01-20 LAB — BRAIN NATRIURETIC PEPTIDE: Pro B Natriuretic peptide (BNP): 4181 pg/mL — ABNORMAL HIGH (ref 0.0–100.0)

## 2011-01-20 MED ORDER — SODIUM CHLORIDE 0.9 % IV BOLUS (SEPSIS)
250.0000 mL | Freq: Once | INTRAVENOUS | Status: AC
  Administered 2011-01-20: 250 mL via INTRAVENOUS

## 2011-01-20 MED ORDER — FUROSEMIDE 10 MG/ML IJ SOLN
80.0000 mg | Freq: Once | INTRAMUSCULAR | Status: AC
  Administered 2011-01-20: 80 mg via INTRAVENOUS

## 2011-01-20 NOTE — Telephone Encounter (Signed)
Message reviewed with Dr. Graciela Husbands. Orders received to have the patient come in for an EKG, BMET, & BNP today. We will evaluate EKG and labs and then make a decision about his weight gain. The patient's wife is aware and verbalizes understanding. They will come as soon as they can get ready.

## 2011-01-20 NOTE — Assessment & Plan Note (Signed)
No evidence of ischemia 

## 2011-01-20 NOTE — Patient Instructions (Addendum)
Your physician recommends that you return for lab work today: bmp/bnp  Your physician has recommended you make the following change in your medication:  1) Restart furosemide at 80mg  one tablet twice daily.  Continue to weigh daily. Call us back on Thursday if your weight is not down 4 pounds.

## 2011-01-20 NOTE — Assessment & Plan Note (Signed)
The patient manifested volume overload. We will give him intravenous Lasix and resume his b.i.d. Lasix. We'll check his metabolic profile and BNP today. Hopefully will be able to treat this as an outpatient.

## 2011-01-20 NOTE — Assessment & Plan Note (Signed)
He is holding sinus rhythm. 

## 2011-01-20 NOTE — Telephone Encounter (Signed)
I spoke with the patient's wife. She states the patient's weight is usually around 145 lbs. His lasix was decreased from 80mg  bid on 01/15/11 to 80mg  once daily due to a BUN on 28 & creatinine of 1.4 on 01/14/11.He was cardioverted on 01/16/11. On 7/21 his weight was 146 lbs, 7/22- 149 lbs, & today, he is 154 lbs. The patient's norvasc was decreased at his last office visit due to lower extremity edema that the patient was already having. Per the patient's wife, his edema is no worse, but he feels like his urine output is down and he is having chest congestion and coughing up clear sputum. I will review with Dr. Graciela Husbands and call the patient back.

## 2011-01-20 NOTE — Telephone Encounter (Signed)
Message nurse and front desk aware the patient is coming for is EKG.

## 2011-01-20 NOTE — Progress Notes (Signed)
HPI  Ian Williams is a 77 y.o. male seen in As an add-on today following a cardioversion last week for atrial fibrillation. Following that he has had problems with shortness of breath. We had in anticipation of that procedure decreased his Lasix and told his digoxin because of elevated creatinine. According to his wife his weight is up 6 or 7 pounds over the weekend.  The ICD was mplanted for primary prevention in the setting of ischemic heart disease progressive left ventricular dysfunction and prior bypass.  His device implantation was complicated by ventricular tachycardia. which developed after ICD implantation and for which he was appropriately shocked. he then had other episodes of ventricular tachycardia . He also was cardioverted from what was thought to be permanent atrial fibrillation to sinus rhythm. He then underwent device revision with repositioning of the right ventricular lead because of the concern for pacing lead induced arrhythmia. He also received an atrial lead at that time because of the reversion to sinus rhythm.  Was treated concurrently with amiodarone which was initially associated with nausea and lightheadedness. These have abated markedly with down titration.  At the time of his upgrade he also received An LV lead. However, He noted that his breathing wasconsiderably better prior to CRT implantation; His LV lead was turned off and he feels a good deal better      Past Medical History  Diagnosis Date  . Coronary artery disease 02/28/00    CABG  . Chronic atrial fibrillation   . Peripheral vascular disease     Iliac disease and mesenteric disease  . Carotid arterial disease     Left Carotid Endartectomy  . Cardiac arrest 02/24/2000  . Hypertension   . Long term current use of anticoagulant   . History of kidney stones   . Hyperlipidemia   . Single ICD (implantable cardiac defibrillator) in place 04/2010    Revision 06/2010  . History of echocardiogram  01/2010    8/11 Echo: EF=20-25% with septal akinesia and global hypokinesia and and LV systolic dysfunction.10/08 Echo: EF=35-40% with  LV dysfunction  . History of cardiovascular stress test 9/09    9/09 Nuclear: EF=47%,inferoseptal hypokinesia and scar consistent with  old infarction: No significant sichemia.  No change from 07 per Dr. Deborah Chalk    Past Surgical History  Procedure Date  . Coronary artery bypass graft 2001    X4  . Cardiac defibrillator placement     ICD 04/2010  . 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  Totally occluded native cisrculation with some proximal conus collaterals to a small left anterior desending. Patent grafts.    Current Outpatient Prescriptions  Medication Sig Dispense Refill  . acetaminophen (TYLENOL) 325 MG tablet Take 650 mg by mouth as needed.        Marland Kitchen allopurinol (ZYLOPRIM) 300 MG tablet Take 300 mg by mouth daily.        Marland Kitchen amiodarone (PACERONE) 200 MG tablet Take 200 mg by mouth daily.        Marland Kitchen amLODipine (NORVASC) 5 MG tablet Take 1 tablet (5 mg total) by mouth daily.      . cloNIDine (CATAPRES) 0.3 MG tablet Take 0.3 mg by mouth daily.        . digoxin (LANOXIN) 0.125 MG tablet Take 1/2 tablet by mouth once daily.      Marland Kitchen ezetimibe (ZETIA) 10 MG tablet Take 10 mg by mouth daily. 1/2 tablet       .  famotidine (PEPCID) 20 MG tablet Take 20 mg by mouth daily.        . furosemide (LASIX) 80 MG tablet Take 80 mg by mouth 2 (two) times daily.       Marland Kitchen levothyroxine (SYNTHROID, LEVOTHROID) 75 MCG tablet Take 88 mcg by mouth daily.       . metoprolol (LOPRESSOR) 100 MG tablet Take 1 tablet (100 mg total) by mouth 2 (two) times daily.  60 tablet  9  . Multiple Vitamins-Minerals ([employer] SPECTRAVITE ADVANCED PO) Take 1 tablet by mouth daily.        . Multiple Vitamins-Minerals (OCUVITE ADULT 50+) CAPS Take 1 capsule by mouth 2 (two) times daily.        . niacin (NIASPAN) 500 MG CR tablet Take 500 mg by mouth at bedtime.         . Omega-3 Fatty Acids (FISH OIL) 1000 MG CAPS Take 1 capsule by mouth 2 (two) times daily.        . potassium chloride SA (K-DUR,KLOR-CON) 20 MEQ tablet Take 20 mEq by mouth 3 (three) times daily.       . ramipril (ALTACE) 5 MG capsule Take 5 mg by mouth 2 (two) times daily.        Marland Kitchen warfarin (COUMADIN) 5 MG tablet Take 1 tablet (5 mg total) by mouth daily. UAD   45 tablet  3    Allergies  Allergen Reactions  . Penicillins   . Sulfonamide Derivatives     Review of Systems negative except from HPI and PMH  Physical Exam Well developed and well nourished in Moderate respiratory distress HENT normal E scleral and icterus clear Neck Supple JVP H.-9 cm; carotids brisk and full Clear to ausculation Regular rate and rhythm,2/6 murmur at the left lower sternal border Soft with active bowel sounds No clubbing cyanosis 1-2+ edema bilaterallyAlert and oriented, grossly normal motor and sensory function Skin Warm and Dry  Atrial pacing with intrinsic conduction and a QRS duration 80 ms  Assessment and  Plan

## 2011-01-20 NOTE — Telephone Encounter (Signed)
Pt's feet and ankles are swollen since last week, had cardioversion last Monday, but has gained 7 lbs on two days-pls advise

## 2011-01-23 ENCOUNTER — Ambulatory Visit (INDEPENDENT_AMBULATORY_CARE_PROVIDER_SITE_OTHER): Payer: Medicare Other | Admitting: *Deleted

## 2011-01-23 DIAGNOSIS — I4891 Unspecified atrial fibrillation: Secondary | ICD-10-CM

## 2011-01-23 LAB — POCT INR: INR: 2.9

## 2011-01-24 ENCOUNTER — Encounter: Payer: Self-pay | Admitting: Cardiology

## 2011-01-29 ENCOUNTER — Ambulatory Visit (INDEPENDENT_AMBULATORY_CARE_PROVIDER_SITE_OTHER): Payer: Medicare Other | Admitting: Internal Medicine

## 2011-01-29 ENCOUNTER — Encounter: Payer: Self-pay | Admitting: Internal Medicine

## 2011-01-29 DIAGNOSIS — I2589 Other forms of chronic ischemic heart disease: Secondary | ICD-10-CM

## 2011-01-29 DIAGNOSIS — I4891 Unspecified atrial fibrillation: Secondary | ICD-10-CM

## 2011-01-29 DIAGNOSIS — I472 Ventricular tachycardia: Secondary | ICD-10-CM

## 2011-01-29 DIAGNOSIS — Z95810 Presence of automatic (implantable) cardiac defibrillator: Secondary | ICD-10-CM

## 2011-01-29 DIAGNOSIS — I5023 Acute on chronic systolic (congestive) heart failure: Secondary | ICD-10-CM

## 2011-01-29 DIAGNOSIS — I255 Ischemic cardiomyopathy: Secondary | ICD-10-CM

## 2011-01-29 LAB — BASIC METABOLIC PANEL
BUN: 22 mg/dL (ref 6–23)
CO2: 30 mEq/L (ref 19–32)
Chloride: 102 mEq/L (ref 96–112)
Glucose, Bld: 135 mg/dL — ABNORMAL HIGH (ref 70–99)
Potassium: 3.3 mEq/L — ABNORMAL LOW (ref 3.5–5.1)

## 2011-01-29 LAB — ICD DEVICE OBSERVATION
AL IMPEDENCE ICD: 494 Ohm
AL THRESHOLD: 0.75 V
BATTERY VOLTAGE: 3.1662 V
CHARGE TIME: 6.536 s
LV LEAD IMPEDENCE ICD: 4047 Ohm
PACEART VT: 0
RV LEAD AMPLITUDE: 18.6 mv
TOT-0001: 0
TOT-0002: 0
TOT-0006: 20120106000000
TZAT-0001ATACH: 2
TZAT-0001FASTVT: 1
TZAT-0001SLOWVT: 1
TZAT-0002FASTVT: NEGATIVE
TZAT-0012ATACH: 150 ms
TZAT-0012ATACH: 150 ms
TZAT-0013SLOWVT: 3
TZAT-0018ATACH: NEGATIVE
TZAT-0018SLOWVT: NEGATIVE
TZAT-0019SLOWVT: 8 V
TZAT-0020ATACH: 1.5 ms
TZAT-0020ATACH: 1.5 ms
TZAT-0020ATACH: 1.5 ms
TZAT-0020SLOWVT: 1.5 ms
TZON-0003ATACH: 350 ms
TZON-0003VSLOWVT: 400 ms
TZON-0004SLOWVT: 32
TZON-0005SLOWVT: 12
TZST-0001ATACH: 4
TZST-0001FASTVT: 3
TZST-0001FASTVT: 4
TZST-0001FASTVT: 5
TZST-0001SLOWVT: 2
TZST-0001SLOWVT: 4
TZST-0001SLOWVT: 5
TZST-0001SLOWVT: 6
TZST-0002FASTVT: NEGATIVE
TZST-0002FASTVT: NEGATIVE
TZST-0003SLOWVT: 35 J
TZST-0003SLOWVT: 35 J

## 2011-01-29 NOTE — Patient Instructions (Addendum)
Your physician has recommended you make the following change in your medication:  1) Decrease Lasix (furosemide) to 80mg  one tablet daily.     - If you have a 3 lb. Weight gain, then you may take an extra lasix that day in the evening.  Your physician recommends that you have lab work today: bmp (428.0).  Your physician recommends that you keep your  follow-up appointment on: Tuesday 02/25/11 @ 11:00am with Dr. Antoine Poche.  Remote monitoring is used to monitor your Pacemaker of ICD from home. This monitoring reduces the number of office visits required to check your device to one time per year. It allows [place] to keep an eye on the functioning of your device to ensure it is working properly. You are scheduled for a device check from home on 05/01/11. You may send your transmission at any time that day. If you have a wireless device, the transmission will be sent automatically. After your physician reviews your transmission, you will receive a postcard with your next transmission date.  Your physician wants you to follow-up in: 1 year with Dr. Graciela Husbands. You will receive a reminder letter in the mail two months in advance. If you don't receive a letter, please call our office to schedule the follow-up appointment.

## 2011-01-29 NOTE — Assessment & Plan Note (Signed)
The patient's device was interrogated.  The information was reviewed. No changes were made in the programming.    

## 2011-01-29 NOTE — Progress Notes (Signed)
HPI  Ian Williams is a 77 y.o. male Was seen last week as an add-on followed at cardioversion for atrial fibrillation the week before. He had developed significant shortness of breath and had put on 6 or 7 pounds. On evaluation at that time he had evidence of significant volume overload. I should note that we had adjusted his diuretics because of increasing levels of creatinine.  On that day we gave him IV Lasix and increased his Lasix from 40 b.i.d. To 80 b.i.d. He comes in today with a 6 pound weight loss and feeling much better. His rectal shortness of breath is much improved    He has a history ofb ischemic heart disease;  progressive left ventricular dysfunction and prior bypass.  He underwent ICD implantation for primary prevention winter 2012   His device implantation was complicated by ventricular tachycardia. which developed after ICD implantation and for which he was appropriately shocked. he then had other episodes of ventricular tachycardia . He also was cardioverted from what was thought to be permanent atrial fibrillation to sinus rhythm. He then underwent device revision with repositioning of the right ventricular lead because of the concern for pacing lead induced arrhythmia. He also received an atrial lead at that time because of the reversion to sinus rhythm.    Was treated concurrently with amiodarone which was initially associated with nausea and lightheadedness. These have abated markedly with down titration.  At the time of his upgrade he also received An LV lead. However, He noted that his breathing wasconsiderably better prior to CRT implantation; His LV lead was turned off and he feels a good deal better      Past Medical History  Diagnosis Date  . Coronary artery disease 02/28/00    CABG  . Chronic atrial fibrillation   . Peripheral vascular disease     Iliac disease and mesenteric disease  . Carotid arterial disease     Left Carotid Endartectomy  . Cardiac  arrest 02/24/2000  . Hypertension   . Long term current use of anticoagulant   . History of kidney stones   . Hyperlipidemia   . Single ICD (implantable cardiac defibrillator) in place 04/2010    Revision 06/2010  . History of echocardiogram 01/2010    8/11 Echo: EF=20-25% with septal akinesia and global hypokinesia and and LV systolic dysfunction.10/08 Echo: EF=35-40% with  LV dysfunction  . History of cardiovascular stress test 9/09    9/09 Nuclear: EF=47%,inferoseptal hypokinesia and scar consistent with  old infarction: No significant sichemia.  No change from 07 per Dr. Deborah Chalk  . Ischemic heart disease   . Gout   . Diabetes mellitus type II   . DJD (degenerative joint disease)   . BPH (benign prostatic hyperplasia)     Past Surgical History  Procedure Date  . Coronary artery bypass graft 2001    X4  . Cardiac defibrillator placement     ICD 04/2010  . 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  Totally occluded native cisrculation with some proximal conus collaterals to a small left anterior desending. Patent grafts.  . Transurethral resection of prostate 5/11  . Carotid endarectomy   . Fetal surgery for congenital hernia   . Left eye cataract surgery     Current Outpatient Prescriptions  Medication Sig Dispense Refill  . acetaminophen (TYLENOL) 325 MG tablet Take 650 mg by mouth as needed.        Marland Kitchen allopurinol (ZYLOPRIM)  300 MG tablet Take 300 mg by mouth daily.        Marland Kitchen amiodarone (PACERONE) 200 MG tablet Take 200 mg by mouth daily.        Marland Kitchen amLODipine (NORVASC) 5 MG tablet Take 1 tablet (5 mg total) by mouth daily.      . cloNIDine (CATAPRES) 0.3 MG tablet Take 0.3 mg by mouth daily.        . digoxin (LANOXIN) 0.125 MG tablet Take 1/2 tablet by mouth once daily.      Marland Kitchen ezetimibe (ZETIA) 10 MG tablet Take 10 mg by mouth daily. 1/2 tablet       . famotidine (PEPCID) 20 MG tablet Take 20 mg by mouth daily.        . furosemide (LASIX) 80  MG tablet Take 1 tablet (80 mg total) by mouth 2 (two) times daily.      Marland Kitchen levothyroxine (SYNTHROID, LEVOTHROID) 75 MCG tablet Take 88 mcg by mouth daily.       . metoprolol (LOPRESSOR) 100 MG tablet Take 1 tablet (100 mg total) by mouth 2 (two) times daily.  60 tablet  9  . Multiple Vitamins-Minerals ([employer] SPECTRAVITE ADVANCED PO) Take 1 tablet by mouth daily.        . Multiple Vitamins-Minerals (OCUVITE ADULT 50+) CAPS Take 1 capsule by mouth 2 (two) times daily.        . niacin (NIASPAN) 500 MG CR tablet Take 500 mg by mouth at bedtime.        . Omega-3 Fatty Acids (FISH OIL) 1000 MG CAPS Take 1 capsule by mouth 2 (two) times daily.        . potassium chloride SA (K-DUR,KLOR-CON) 20 MEQ tablet Take 20 mEq by mouth 3 (three) times daily.       . ramipril (ALTACE) 5 MG capsule Take 5 mg by mouth 2 (two) times daily.        Marland Kitchen warfarin (COUMADIN) 5 MG tablet Take 1 tablet (5 mg total) by mouth daily. UAD   45 tablet  3    Allergies  Allergen Reactions  . Penicillins   . Sulfonamide Derivatives     Review of Systems negative except from HPI and PMH  Physical Exam Well developed and well nourished in Moderate respiratory distress HENT normal E scleral and icterus clear Neck Supple JVP 6-7 cm; carotids brisk and full Clear to ausculation Regular rate and rhythm,2/6 murmur at the left lower sternal border Soft with active bowel sounds No clubbing cyanosis 1-+ edema bilaterally Alert and oriented, grossly normal motor and sensory function Skin Warm and Dry   s  Assessment and  Plan

## 2011-01-29 NOTE — Assessment & Plan Note (Signed)
Stable

## 2011-01-29 NOTE — Assessment & Plan Note (Signed)
Holding sinus rhyhtm

## 2011-01-29 NOTE — Assessment & Plan Note (Signed)
No recurrent vt 

## 2011-01-29 NOTE — Assessment & Plan Note (Signed)
Much improved. We will reduce his Lasix from twice daily to once daily. We will check his metabolic profile is elevated creatinine previously.

## 2011-01-30 ENCOUNTER — Telehealth: Payer: Self-pay | Admitting: *Deleted

## 2011-01-30 NOTE — Telephone Encounter (Signed)
Attempted to get dr Graciela Husbands in EP lab--LMTCB--nt

## 2011-01-31 ENCOUNTER — Encounter: Payer: Medicare Other | Admitting: *Deleted

## 2011-02-03 ENCOUNTER — Ambulatory Visit (INDEPENDENT_AMBULATORY_CARE_PROVIDER_SITE_OTHER): Payer: Medicare Other | Admitting: *Deleted

## 2011-02-03 ENCOUNTER — Other Ambulatory Visit: Payer: Self-pay | Admitting: *Deleted

## 2011-02-03 DIAGNOSIS — Z79899 Other long term (current) drug therapy: Secondary | ICD-10-CM

## 2011-02-03 DIAGNOSIS — I5023 Acute on chronic systolic (congestive) heart failure: Secondary | ICD-10-CM

## 2011-02-03 LAB — BASIC METABOLIC PANEL
CO2: 25 mEq/L (ref 19–32)
GFR: 58.29 mL/min — ABNORMAL LOW (ref >60.00)
Glucose, Bld: 208 mg/dL — ABNORMAL HIGH (ref 70–99)
Potassium: 4.2 mEq/L (ref 3.5–5.1)
Sodium: 134 mEq/L — ABNORMAL LOW (ref 135–145)

## 2011-02-12 ENCOUNTER — Other Ambulatory Visit: Payer: Medicare Other | Admitting: *Deleted

## 2011-02-18 ENCOUNTER — Other Ambulatory Visit: Payer: Self-pay | Admitting: Cardiology

## 2011-02-19 MED ORDER — POTASSIUM CHLORIDE CRYS ER 20 MEQ PO TBCR: 20.0000 meq | EXTENDED_RELEASE_TABLET | Freq: Three times a day (TID) | ORAL | Status: DC

## 2011-02-24 NOTE — Op Note (Signed)
  Ian Williams, Ian Williams              ACCOUNT NO.:  [id number]  MEDICAL RECORD NO.:  [id number]  LOCATION:  [facility]                         FACILITY:  [facility]  PHYSICIAN:  Duke Salvia, MD, FACCDATE OF BIRTH:  [birth year]  DATE OF PROCEDURE:  01/16/2011 DATE OF DISCHARGE:  01/16/2011                              OPERATIVE REPORT   PREOPERATIVE DIAGNOSIS:  Atrial fibrillation.  POSTOPERATIVE DIAGNOSIS:  Sinus rhythm.  PROCEDURE:  DC cardioversion.  The patient was admitted for Anesthesia under the care of Dr. Krista Blue. He received 12 mg etomidate.  A 35-joule shock was delivered via his ICD.  Sinus rhythm was restored. The device was reprogramed to a base rate of 80 with a sleep rate of 60.  High-voltage impedances were normal.     Duke Salvia, MD, [facility]     SCK/MEDQ  D:  01/16/2011  T:  01/16/2011  Job:  [id number]  Electronically Signed by Sherryl Manges MD [facility] on 02/24/2011 01:48:22 PM

## 2011-02-25 ENCOUNTER — Ambulatory Visit (INDEPENDENT_AMBULATORY_CARE_PROVIDER_SITE_OTHER): Payer: Medicare Other | Admitting: Cardiology

## 2011-02-25 ENCOUNTER — Encounter: Payer: Self-pay | Admitting: Cardiology

## 2011-02-25 ENCOUNTER — Ambulatory Visit (INDEPENDENT_AMBULATORY_CARE_PROVIDER_SITE_OTHER): Payer: Medicare Other | Admitting: *Deleted

## 2011-02-25 DIAGNOSIS — I4891 Unspecified atrial fibrillation: Secondary | ICD-10-CM

## 2011-02-25 DIAGNOSIS — I2589 Other forms of chronic ischemic heart disease: Secondary | ICD-10-CM

## 2011-02-25 DIAGNOSIS — I255 Ischemic cardiomyopathy: Secondary | ICD-10-CM

## 2011-02-25 DIAGNOSIS — I10 Essential (primary) hypertension: Secondary | ICD-10-CM

## 2011-02-25 DIAGNOSIS — I5022 Chronic systolic (congestive) heart failure: Secondary | ICD-10-CM

## 2011-02-25 MED ORDER — METOLAZONE 2.5 MG PO TABS: 2.5000 mg | ORAL_TABLET | Freq: Every day | ORAL | Status: DC

## 2011-02-25 NOTE — Assessment & Plan Note (Signed)
He is s/p DCCV and is on coumadin.  No change in therapy is indicated.

## 2011-02-25 NOTE — Assessment & Plan Note (Signed)
His blood pressure is low but might allow up titration of meds.  We will evaluate at follow up appts.

## 2011-02-25 NOTE — Assessment & Plan Note (Addendum)
He has class IV symptoms.  However, he wants to try treatment at home.  I will increase the Lasix and add zaroxolyn.  He is to keep is feet elevated.  He will come back to the CHF clinic in two days and he will need a BMET.  I suspect that he will need to be admitted.  We will also need to understand advanced directives.   (Greater than 40 minutes reviewing all data with greater than 50% face to face with the patient).

## 2011-02-25 NOTE — Progress Notes (Signed)
HPI The patient was formerly seen by Dr. Deborah Chalk.  He has a long history of severe ischemic CM.  EF in the past was as low as 15% by cath.  He sees Dr. Graciela Husbands who performed a DCCV recently.  Dr. Graciela Husbands has also does suggest the his diuretics as he was volume overloaded recently. However, he also has had renal insufficiency. He has a defibrillator but currently has an LV lead had been turned off as he had no improvement with this. I looked over some weight charts today and she has had an 11 pound weight gain this month with or 5 pounds over the last week or so. He is weaker. He does have weeping lower extremity edema. These had decreased appetite. He is not describing PND or orthopnea. He is not describing chest pressure, neck or arm discomfort. He is not using salt and watches his fluid intake. His wife does report that he's had some increased confusion particularly at night. He does take extra Lasix as needed and has done this a few times recently. Of note he does not keep his feet elevated.  Allergies  Allergen Reactions  . Penicillins   . Sulfonamide Derivatives     Current Outpatient Prescriptions  Medication Sig Dispense Refill  . acetaminophen (TYLENOL) 325 MG tablet Take 650 mg by mouth as needed.        Marland Kitchen allopurinol (ZYLOPRIM) 300 MG tablet Take 300 mg by mouth daily.        Marland Kitchen amiodarone (PACERONE) 200 MG tablet Take 200 mg by mouth daily.        Marland Kitchen amLODipine (NORVASC) 5 MG tablet Take 1 tablet (5 mg total) by mouth daily.      . cloNIDine (CATAPRES) 0.3 MG tablet Take 0.3 mg by mouth daily.        . digoxin (LANOXIN) 0.125 MG tablet Take 1/2 tablet by mouth once daily.      Marland Kitchen ezetimibe (ZETIA) 10 MG tablet Take 10 mg by mouth daily. 1/2 tablet       . famotidine (PEPCID) 20 MG tablet Take 20 mg by mouth daily.        . furosemide (LASIX) 80 MG tablet Take one tablet by mouth once daily.      Marland Kitchen levothyroxine (SYNTHROID, LEVOTHROID) 100 MCG tablet Take 100 mcg by mouth daily.        .  metoprolol (LOPRESSOR) 100 MG tablet Take 1 tablet (100 mg total) by mouth 2 (two) times daily.  60 tablet  9  . Multiple Vitamins-Minerals (CVS SPECTRAVITE ADVANCED PO) Take 1 tablet by mouth daily.        . Multiple Vitamins-Minerals (OCUVITE ADULT 50+) CAPS Take 1 capsule by mouth 2 (two) times daily.        . niacin (NIASPAN) 500 MG CR tablet Take 500 mg by mouth at bedtime.        . Omega-3 Fatty Acids (FISH OIL) 1000 MG CAPS Take 1 capsule by mouth 2 (two) times daily.        . potassium chloride SA (K-DUR,KLOR-CON) 20 MEQ tablet Take 1 tablet (20 mEq total) by mouth 3 (three) times daily.  90 tablet  6  . ramipril (ALTACE) 5 MG capsule Take 5 mg by mouth 2 (two) times daily.        Marland Kitchen warfarin (COUMADIN) 5 MG tablet Take 1 tablet (5 mg total) by mouth daily. UAD   45 tablet  3    Past Medical History  Diagnosis Date  . Ischemic cardiomyopathy 02/28/00    CABG;8/11 Echo: EF=20-25% with septal akinesia and global hypokinesia and and LV systolic dysfunction.10/08 Echo: EF=35-40% with  LV dysfunction  . Paroxysmal atrial fibrillation     Referred at at time of ICD implantation-now on amiodarone  . Peripheral vascular disease     Iliac disease and mesenteric disease  . Carotid arterial disease     Left Carotid Endartectomy  . Cardiac arrest 02/24/2000  . Hypertension   . Long term current use of anticoagulant   . History of kidney stones   . Hyperlipidemia   . Dual implantable cardiac defibrillator in situ 04/2010    Revision 06/2010 -Medtronic  . Gout   . Diabetes mellitus type II   . DJD (degenerative joint disease)   . BPH (benign prostatic hyperplasia)     Past Surgical History  Procedure Date  . Coronary artery bypass graft 2001    X4  . Cardiac defibrillator placement     ICD 04/2010  . 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  Totally occluded native cisrculation with some proximal conus collaterals to a small left anterior desending.  Patent grafts.  . Transurethral resection of prostate 5/11  . Carotid endarectomy   . Fetal surgery for congenital hernia   . Left eye cataract surgery     ROS:  As stated in the HPI and negative for all other systems.  PHYSICAL EXAM BP 104/72  Pulse 80  Resp 16  Ht 5\' 6"  (1.676 m)  Wt 157 lb (71.215 kg)  BMI 25.34 kg/m2 GENERAL:  Frail and chronically ill appearing HEENT:  Pupils equal round and reactive, fundi not visualized, oral mucosa unremarkable, dentures NECK:  JVD to the angel 90 degrees, carotid upstroke brisk and symmetric, no bruits, no thyromegaly LYMPHATICS:  No cervical, inguinal adenopathy LUNGS:  Clear to auscultation bilaterally BACK:  No CVA tenderness CHEST:  Unremarkable HEART:  PMI not displaced or sustained,S1 and S2 within normal limits, no S3, no S4, no clicks, no rubs, no murmurs, PMI displaced and sustained, 3/6 apical systolic murmur.   ABD:  Flat, positive bowel sounds normal in frequency in pitch, no bruits, no rebound, no guarding, no midline pulsatile mass, no hepatomegaly, no splenomegaly EXT:  2 plus pulses throughout, severe bilateral edema above the knees, no cyanosis no clubbing, diffuse muscle wasting SKIN:  No rashes no nodules NEURO:  Cranial nerves II through XII grossly intact, motor grossly intact throughout PSYCH:  Cognitively intact, oriented to person place and time  ASSESSMENT AND PLAN

## 2011-02-25 NOTE — Patient Instructions (Signed)
Please increase your Lasix (Furosemide) to 80 mg in the AM and 40 mg in the afternoon. Please start Zaroxolyn 2.5 mg once a day Continue all other medications as listed.  Have blood work in 2 days.  You had been scheduled for a follow up appointment in the [facility] on Thursday August 30th at 9:30 am. Please go to the clinic by following the signs for the [facility] located at the front of [facility].  Please park in the parking garage.  The code to get into the gate for August is 0005.  Once parked take the elevator to the first floor and go to where you see the glass windows.

## 2011-02-27 ENCOUNTER — Encounter (HOSPITAL_COMMUNITY): Payer: Self-pay

## 2011-02-27 ENCOUNTER — Ambulatory Visit (HOSPITAL_COMMUNITY)
Admission: RE | Admit: 2011-02-27 | Discharge: 2011-02-27 | Disposition: A | Discharge status: Home / Self Care | Payer: Medicare Other | Source: Ambulatory Visit | Attending: Internal Medicine | Admitting: Internal Medicine

## 2011-02-27 ENCOUNTER — Encounter: Payer: Self-pay | Admitting: Internal Medicine

## 2011-02-27 ENCOUNTER — Telehealth (HOSPITAL_COMMUNITY): Payer: Self-pay | Admitting: *Deleted

## 2011-02-27 DIAGNOSIS — Z95810 Presence of automatic (implantable) cardiac defibrillator: Secondary | ICD-10-CM | POA: Insufficient documentation

## 2011-02-27 DIAGNOSIS — I4891 Unspecified atrial fibrillation: Secondary | ICD-10-CM

## 2011-02-27 DIAGNOSIS — I5022 Chronic systolic (congestive) heart failure: Secondary | ICD-10-CM

## 2011-02-27 DIAGNOSIS — Z8674 Personal history of sudden cardiac arrest: Secondary | ICD-10-CM | POA: Insufficient documentation

## 2011-02-27 LAB — BASIC METABOLIC PANEL
BUN: 27 mg/dL — ABNORMAL HIGH (ref 6–23)
Chloride: 90 mEq/L — ABNORMAL LOW (ref 96–112)
GFR calc Af Amer: >60 mL/min (ref >60)
Potassium: 2.4 mEq/L — CL (ref 3.5–5.1)

## 2011-02-27 MED ORDER — SPIRONOLACTONE 25 MG PO TABS: 12.5000 mg | ORAL_TABLET | Freq: Every day | ORAL | Status: DC

## 2011-02-27 NOTE — Patient Instructions (Signed)
Continue Furosemide 80 mg in AM and 40 mg in PM Decrease Clonidine to 1/2 tab for 3 days then STOP Stop Metolazone Stop Amiodarone  If weight is up 3 lbs in 24 hours take extra dose of Furosemide, if weight not down the next day take 1 dose of Metolazone 2.5 mg, if weight continues to still up give [place] a call   Labs today  Your physician recommends that you schedule a follow-up appointment in: once a week for the next 4 weeks

## 2011-02-27 NOTE — Assessment & Plan Note (Addendum)
NYHA III-IIIB. Marland Kitchen Volume  up. Optivol revealed about 1 hour activity trending down,  back in Afib, and fluid status trending up but not above threshold however  Continue Lasix 80mg  in am and 40 mg in pm. Decrease clonipine 1/2 tablet for three days and then stop.  Stop metolazone and amiodarone.   Check BMET today. Instructed to weight daily and record. If weight is up 3 pounds in 24 hours instructed to take an extra Lasix . If weight does not decrease he will take Metolazone 2.5mg .  Follow up  Weekly for next 4 weeks.

## 2011-02-27 NOTE — Telephone Encounter (Signed)
Pt and his wife are aware, rx for spiro sent to pharmacy

## 2011-02-27 NOTE — Telephone Encounter (Signed)
Message copied by Noralee Space on Thu Feb 27, 2011  4:31 PM ------      Message from: Noralee Space      Created: Thu Feb 27, 2011  3:36 PM       Per Dr Gala Romney have pt take kcl 80 meq today and tomorrow and then start Cleda Daub 12.5

## 2011-02-27 NOTE — Assessment & Plan Note (Signed)
Back in Afib. Stop amiodarone. Discussed with Dr Graciela Husbands.

## 2011-03-04 ENCOUNTER — Other Ambulatory Visit: Payer: Medicare Other | Admitting: *Deleted

## 2011-03-06 ENCOUNTER — Encounter: Payer: Self-pay | Admitting: Internal Medicine

## 2011-03-06 ENCOUNTER — Ambulatory Visit (HOSPITAL_COMMUNITY)
Admission: RE | Admit: 2011-03-06 | Discharge: 2011-03-06 | Disposition: A | Discharge status: Home / Self Care | Payer: Medicare Other | Source: Ambulatory Visit | Attending: Internal Medicine | Admitting: Internal Medicine

## 2011-03-06 DIAGNOSIS — I5022 Chronic systolic (congestive) heart failure: Secondary | ICD-10-CM

## 2011-03-06 DIAGNOSIS — I4891 Unspecified atrial fibrillation: Secondary | ICD-10-CM

## 2011-03-06 LAB — BASIC METABOLIC PANEL
Chloride: 97 mEq/L (ref 96–112)
GFR calc Af Amer: >60 mL/min (ref >60)
GFR calc non Af Amer: >60 mL/min (ref >60)
Glucose, Bld: 136 mg/dL — ABNORMAL HIGH (ref 70–99)
Potassium: 4.5 mEq/L (ref 3.5–5.1)
Sodium: 137 mEq/L (ref 135–145)

## 2011-03-06 NOTE — Assessment & Plan Note (Addendum)
Volume status much improved however remains NYHA III-IIIB. Will Decrease lasix to 40 bid and continue spiro 12.5. Continue to follow weights closely and may need to increase spiro to 25 qd if going back up. Check Optivol and labs today. Not candidate for LVAD or transplant. I suspect we will need to discuss advanced directives vs home intoropes at some point in the near future. Will arrange home PT evaluation. Time spent 50 minutes. Will f/u with Dr. Antoine Poche.  Pt seen and examined with Tonye Becket, NP. I agree with all parts of the assessment and plan.

## 2011-03-06 NOTE — Progress Notes (Signed)
HPI:  77 year old male ischemic cardiomyopathy, chronic systolic heart failure EF 15% followed by Dr. Antoine Poche and Dr. Darcus Austin.   PMHx notable - AF s/p DC-CV 7/12, CAD s/p CABG, gout and hypothyroid. S/p BiVICD medtronic MI - LV lead turned off due to dyspnea  Initially seen by Dr. Antoine Poche and weight was up. Treated with metolazone for several days with marked response. Referred for HF clinic for further management.   Returns for f/u. Continues to diurese. Continues to have fatigue. Weighing daily at home 131-137 pounds.  Appetite ok but not eating much. Able to perform ADLs. Not  much activity. He started using oxygen at night and feels better. .Sleeps on 1 pillow at night. No edema in legs. Uses compression stockings.  No CP. + dyspnea on exertion. Wife said he is very limited and can barely do much more then his ADLs.   ROS: All other systems normal except as mentioned in HPI, past medical history and problem list.    Past Medical History  Diagnosis Date  . Ischemic cardiomyopathy 02/28/00    CABG;8/11 Echo: EF=20-25% with septal akinesia and global hypokinesia and and LV systolic dysfunction.10/08 Echo: EF=35-40% with  LV dysfunction  . Paroxysmal atrial fibrillation     Referred at at time of ICD implantation-now on amiodarone  . Peripheral vascular disease     Iliac disease and mesenteric disease  . Carotid arterial disease     Left Carotid Endartectomy  . Cardiac arrest 02/24/2000  . Hypertension   . Long term current use of anticoagulant   . History of kidney stones   . Hyperlipidemia   . Dual implantable cardiac defibrillator in situ 04/2010    Revision 06/2010 -Medtronic  . Gout   . Diabetes mellitus type II   . DJD (degenerative joint disease)   . BPH (benign prostatic hyperplasia)     Current Outpatient Prescriptions  Medication Sig Dispense Refill  . acetaminophen (TYLENOL) 325 MG tablet Take 650 mg by mouth as needed.        Marland Kitchen allopurinol (ZYLOPRIM) 300 MG tablet  Take 300 mg by mouth daily.        Marland Kitchen amLODipine (NORVASC) 5 MG tablet Take 1 tablet (5 mg total) by mouth daily.      . digoxin (LANOXIN) 0.125 MG tablet Take 1/2 tablet by mouth once daily.      Marland Kitchen ezetimibe (ZETIA) 10 MG tablet Take 10 mg by mouth daily. 1/2 tablet       . famotidine (PEPCID) 20 MG tablet Take 20 mg by mouth daily.        . furosemide (LASIX) 80 MG tablet Take one morning and half in the evening      . levothyroxine (SYNTHROID, LEVOTHROID) 100 MCG tablet Take 100 mcg by mouth daily.        . metolazone (ZAROXOLYN) 2.5 MG tablet Take 2.5 mg by mouth as needed.        . metoprolol (LOPRESSOR) 100 MG tablet Take 1 tablet (100 mg total) by mouth 2 (two) times daily.  60 tablet  9  . Multiple Vitamins-Minerals (CVS SPECTRAVITE ADVANCED PO) Take 1 tablet by mouth daily.        . Multiple Vitamins-Minerals (OCUVITE ADULT 50+) CAPS Take 1 capsule by mouth 2 (two) times daily.        . niacin (NIASPAN) 500 MG CR tablet Take 500 mg by mouth at bedtime.        . Omega-3 Fatty Acids (FISH  OIL) 1000 MG CAPS Take 1 capsule by mouth 2 (two) times daily.        . potassium chloride SA (K-DUR,KLOR-CON) 20 MEQ tablet Take 1 tablet (20 mEq total) by mouth 3 (three) times daily.  90 tablet  6  . ramipril (ALTACE) 5 MG capsule Take 5 mg by mouth 2 (two) times daily.        Marland Kitchen spironolactone (ALDACTONE) 25 MG tablet Take 0.5 tablets (12.5 mg total) by mouth daily.  30 tablet  6  . warfarin (COUMADIN) 5 MG tablet Take 1 tablet (5 mg total) by mouth daily. UAD   45 tablet  3     Allergies  Allergen Reactions  . Penicillins   . Sulfonamide Derivatives     History   Social History  . Marital Status: Married    Spouse Name: N/A    Number of Children: 10  . Years of Education: N/A   Occupational History  .     Social History Main Topics  . Smoking status: Former Smoker -- 1.0 packs/day    Types: Cigarettes    Quit date: 07/01/1999  . Smokeless tobacco: Never Used  . Alcohol Use: No      previously drank 2 drinks and a pack of cigarettes per day but has stopped since 2001  . Drug Use: Not on file  . Sexually Active: Not on file   Other Topics Concern  . Not on file   Social History Narrative  . No narrative on file    Family History  Problem Relation Age of Onset  . Stroke Father   . Cancer Mother   . Cardiomyopathy Brother     PHYSICAL EXAM: Filed Vitals:   03/06/11 0945  BP: 106/57  Pulse: 87  Wife present General:  Frail appearing. No respiratory difficulty HEENT: normal Neck: supple. JVP 6-7 . Carotids 2+ bilat; no bruits. No lymphadenopathy or thryomegaly appreciated. Cor: PMI nondisplaced. Irregular rate & rhythm. S3 Lungs: RLL decreased Abdomen: soft, nontender, nondistended. No hepatosplenomegaly. No bruits or masses. Good bowel sounds. Extremities: no cyanosis, clubbing, rash, no edema, compression stockings, LLE pretibial scab  Neuro: alert & oriented x 3, cranial nerves grossly intact. moves all 4 extremities w/o difficulty. Affect pleasant.   ASSESSMENT & PLAN:

## 2011-03-06 NOTE — Assessment & Plan Note (Signed)
Back in Afib.

## 2011-03-06 NOTE — Patient Instructions (Signed)
Decrease Furosemide to 40 mg Twice daily   Lab today  You have been referred to [facility]  Your physician recommends that you schedule a follow-up appointment in: next week as scheduled

## 2011-03-11 ENCOUNTER — Encounter (HOSPITAL_COMMUNITY): Payer: Self-pay

## 2011-03-11 ENCOUNTER — Encounter: Payer: Self-pay | Admitting: Internal Medicine

## 2011-03-11 ENCOUNTER — Ambulatory Visit (HOSPITAL_COMMUNITY)
Admission: RE | Admit: 2011-03-11 | Discharge: 2011-03-11 | Disposition: A | Discharge status: Home / Self Care | Payer: Medicare Other | Source: Ambulatory Visit | Attending: Internal Medicine | Admitting: Internal Medicine

## 2011-03-11 VITALS — BP 100/60 | HR 82 | Resp 18 | Wt 136.0 lb

## 2011-03-11 DIAGNOSIS — I4891 Unspecified atrial fibrillation: Secondary | ICD-10-CM

## 2011-03-11 DIAGNOSIS — I5022 Chronic systolic (congestive) heart failure: Secondary | ICD-10-CM

## 2011-03-11 LAB — BASIC METABOLIC PANEL
Chloride: 101 mEq/L (ref 96–112)
GFR calc Af Amer: >60 mL/min (ref >60)
Potassium: 4.1 mEq/L (ref 3.5–5.1)

## 2011-03-11 NOTE — Assessment & Plan Note (Addendum)
Failed DC-CV in 7/12 (on amiodarone). Now with chronic AF which he seems to be tolerating. Options limited given amio failure.

## 2011-03-11 NOTE — Assessment & Plan Note (Addendum)
Improving steadily. NYHA III. Volume status looks good on exam and by Optivol. Overall feels like he has more energy but activity level remains low. Will continue to follow. Continue current regimen. Reviewed use of sliding scale lasix.   Pt seen and examined with Tonye Becket, NP. I agree with all parts of the assessment and plan.

## 2011-03-11 NOTE — Progress Notes (Signed)
HPI:  77 year old male ischemic cardiomyopathy, chronic systolic heart failure EF 15% followed by Dr. Antoine Poche and Dr. Darcus Austin.   PMHx notable - AF s/p DC-CV 7/12, CAD s/p CABG, gout and hypothyroid. S/p BiVICD medtronic MI - LV lead turned off due to dyspnea  Initially seen by Dr. Antoine Poche and weight was up. Treated with metolazone for several days with marked response. Referred for HF clinic for further management.   Returns for f/u. At last visit we cut lasix back for 80/40 to 40/40 due to orthostasis. He reports less fatigue. Weighing daily at home 134 pounds.  Appetite improving. Able to perform ADLs. Not  much activity. Continues to use oxygen at night.  .Sleeps on 1 pillow at night. No edema in legs. Uses compression stockings.  No CP. No dyspnea. Able to walk dog.  He is able to do more activity around the house. He refused Physical Therapy consult as ordered last week.    ROS: All other systems normal except as mentioned in HPI, past medical history and problem list.    Past Medical History  Diagnosis Date  . Ischemic cardiomyopathy 02/28/00    CABG;8/11 Echo: EF=20-25% with septal akinesia and global hypokinesia and and LV systolic dysfunction.10/08 Echo: EF=35-40% with  LV dysfunction  . Paroxysmal atrial fibrillation     Referred at at time of ICD implantation-now on amiodarone  . Peripheral vascular disease     Iliac disease and mesenteric disease  . Carotid arterial disease     Left Carotid Endartectomy  . Cardiac arrest 02/24/2000  . Hypertension   . Long term current use of anticoagulant   . History of kidney stones   . Hyperlipidemia   . Dual implantable cardiac defibrillator in situ 04/2010    Revision 06/2010 -Medtronic  . Gout   . Diabetes mellitus type II   . DJD (degenerative joint disease)   . BPH (benign prostatic hyperplasia)     Current Outpatient Prescriptions  Medication Sig Dispense Refill  . acetaminophen (TYLENOL) 325 MG tablet Take 650 mg by mouth  as needed.        Marland Kitchen allopurinol (ZYLOPRIM) 300 MG tablet Take 300 mg by mouth daily.        Marland Kitchen amLODipine (NORVASC) 5 MG tablet Take 1 tablet (5 mg total) by mouth daily.      . digoxin (LANOXIN) 0.125 MG tablet Take 1/2 tablet by mouth once daily.      Marland Kitchen ezetimibe (ZETIA) 10 MG tablet Take 10 mg by mouth daily. 1/2 tablet       . famotidine (PEPCID) 20 MG tablet Take 20 mg by mouth daily.        . furosemide (LASIX) 80 MG tablet Take one morning and half in the evening      . levothyroxine (SYNTHROID, LEVOTHROID) 100 MCG tablet Take 100 mcg by mouth daily.        . metolazone (ZAROXOLYN) 2.5 MG tablet Take 2.5 mg by mouth as needed.        . metoprolol (LOPRESSOR) 100 MG tablet Take 1 tablet (100 mg total) by mouth 2 (two) times daily.  60 tablet  9  . Multiple Vitamins-Minerals ([employer] SPECTRAVITE ADVANCED PO) Take 1 tablet by mouth daily.        . Multiple Vitamins-Minerals (OCUVITE ADULT 50+) CAPS Take 1 capsule by mouth 2 (two) times daily.        . niacin (NIASPAN) 500 MG CR tablet Take 500 mg by mouth at  bedtime.        . Omega-3 Fatty Acids (FISH OIL) 1000 MG CAPS Take 1 capsule by mouth 2 (two) times daily.        . potassium chloride SA (K-DUR,KLOR-CON) 20 MEQ tablet Take 1 tablet (20 mEq total) by mouth 3 (three) times daily.  90 tablet  6  . ramipril (ALTACE) 5 MG capsule Take 5 mg by mouth 2 (two) times daily.        Marland Kitchen spironolactone (ALDACTONE) 25 MG tablet Take 0.5 tablets (12.5 mg total) by mouth daily.  30 tablet  6  . warfarin (COUMADIN) 5 MG tablet Take 1 tablet (5 mg total) by mouth daily. UAD   45 tablet  3     Allergies  Allergen Reactions  . Penicillins   . Sulfonamide Derivatives     History   Social History  . Marital Status: Married    Spouse Name: N/A    Number of Children: 10  . Years of Education: N/A   Occupational History  .     Social History Main Topics  . Smoking status: Former Smoker -- 1.0 packs/day    Types: Cigarettes    Quit date:  07/01/1999  . Smokeless tobacco: Never Used  . Alcohol Use: No     previously drank 2 drinks and a pack of cigarettes per day but has stopped since 2001  . Drug Use: Not on file  . Sexually Active: Not on file   Other Topics Concern  . Not on file   Social History Narrative  . No narrative on file    Family History  Problem Relation Age of Onset  . Stroke Father   . Cancer Mother   . Cardiomyopathy Brother     PHYSICAL EXAM: Filed Vitals:   03/11/11 1145  BP: 100/60  Pulse: 82  Resp: 18  Wife present Weight 136 pounds General:  Frail appearing. No respiratory difficulty HEENT: normal Neck: supple. JVP 8-9 . Carotids 2+ bilat; no bruits. No lymphadenopathy or thryomegaly appreciated. Cor: PMI nondisplaced. Irregular rate & rhythm. S3 Lungs: RLL decreased Abdomen: soft, nontender, nondistended. No hepatosplenomegaly. No bruits or masses. Good bowel sounds. Extremities: no cyanosis, clubbing, rash, no edema, compression stockings, LLE pretibial scab  Neuro: alert & oriented x 3, cranial nerves grossly intact. moves all 4 extremities w/o difficulty. Affect pleasant.   ASSESSMENT & PLAN:

## 2011-03-11 NOTE — Patient Instructions (Signed)
He is instructed to an extra Lasix if his weight increases 3 pounds. He will follow up in 2 weeks.

## 2011-03-17 ENCOUNTER — Other Ambulatory Visit: Payer: Self-pay | Admitting: *Deleted

## 2011-03-17 MED ORDER — RAMIPRIL 5 MG PO CAPS: 5.0000 mg | ORAL_CAPSULE | Freq: Two times a day (BID) | ORAL | Status: DC

## 2011-03-19 ENCOUNTER — Encounter (HOSPITAL_COMMUNITY): Payer: Medicare Other

## 2011-03-24 ENCOUNTER — Ambulatory Visit (HOSPITAL_COMMUNITY)
Admission: RE | Admit: 2011-03-24 | Discharge: 2011-03-24 | Disposition: A | Discharge status: Home / Self Care | Payer: Medicare Other | Source: Ambulatory Visit | Attending: Internal Medicine | Admitting: Internal Medicine

## 2011-03-24 VITALS — BP 100/62 | HR 68 | Wt 138.8 lb

## 2011-03-24 DIAGNOSIS — I5022 Chronic systolic (congestive) heart failure: Secondary | ICD-10-CM

## 2011-03-24 DIAGNOSIS — I4891 Unspecified atrial fibrillation: Secondary | ICD-10-CM

## 2011-03-24 NOTE — Assessment & Plan Note (Signed)
Has failed DCCV and now in chronic AF.  Continue coumadin and rate control.

## 2011-03-24 NOTE — Assessment & Plan Note (Addendum)
Continues to improve, NYHA III.  Volume status appears stable.  Will continue current medical regimen as it appears optimized.  Reviewed sliding scale lasix.  He will follow up with Dr. Antoine Poche in 3-4 weeks.    Patient seen and examined with Tonye Becket, NP. We discussed all aspects of the encounter. I agree with the assessment and plan as stated above.

## 2011-03-24 NOTE — Progress Notes (Signed)
HPI:  77 year old male ischemic cardiomyopathy, chronic systolic heart failure EF 15% followed by Dr. Antoine Poche and Dr. Darcus Austin.   PMHx notable - AF s/p DC-CV 7/12, CAD s/p CABG, gout and hypothyroid. S/p BiVICD medtronic MI - LV lead turned off due to dyspnea  Initially seen by Dr. Antoine Poche and weight was up. Treated with metolazone for several days with marked response. Referred for HF clinic for further management.  His lasix was decreased from 80/40 to 40/40 due to orthostasis.    He returns for follow up today.  He continues to weigh himself daily and runs between 134-135lbs.  He has not taken any extra lasix since last visit.  He continues to walk with his dog and is able to go several blocks without difficulty.  He climbs stairs at home without much difficulty.  Continues to sleep on 1 pillow at night with O2 use.  He does not use O2 during the day.  He denies CP, orthopnea, PND, edema or dyspnea.  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  Rarely orthostatic.    ROS: All other systems normal except as mentioned in HPI, past medical history and problem list.    Past Medical History  Diagnosis Date  . Ischemic cardiomyopathy 02/28/00    CABG;8/11 Echo: EF=20-25% with septal akinesia and global hypokinesia and and LV systolic dysfunction.10/08 Echo: EF=35-40% with  LV dysfunction  . Paroxysmal atrial fibrillation     Referred at at time of ICD implantation-now on amiodarone  . Peripheral vascular disease     Iliac disease and mesenteric disease  . Carotid arterial disease     Left Carotid Endartectomy  . Cardiac arrest 02/24/2000  . Hypertension   . Long term current use of anticoagulant   . History of kidney stones   . Hyperlipidemia   . Dual implantable cardiac defibrillator in situ 04/2010    Revision 06/2010 -Medtronic  . Gout   . Diabetes mellitus type II   . DJD (degenerative joint disease)   . BPH (benign  prostatic hyperplasia)     Current Outpatient Prescriptions  Medication Sig Dispense Refill  . acetaminophen (TYLENOL) 325 MG tablet Take 650 mg by mouth as needed.        Marland Kitchen allopurinol (ZYLOPRIM) 300 MG tablet Take 300 mg by mouth daily.        Marland Kitchen amLODipine (NORVASC) 5 MG tablet Take 1 tablet (5 mg total) by mouth daily.      . digoxin (LANOXIN) 0.125 MG tablet Take 1/2 tablet by mouth once daily.      Marland Kitchen ezetimibe (ZETIA) 10 MG tablet Take 10 mg by mouth daily. 1/2 tablet       . famotidine (PEPCID) 20 MG tablet Take 20 mg by mouth daily.        . furosemide (LASIX) 80 MG tablet Take 40 mg by mouth 2 (two) times daily.       Marland Kitchen levothyroxine (SYNTHROID, LEVOTHROID) 100 MCG tablet Take 100 mcg by mouth daily.        . metolazone (ZAROXOLYN) 2.5 MG tablet Take 2.5 mg by mouth as needed.        . metoprolol (LOPRESSOR) 100 MG tablet Take 1 tablet (100 mg total) by mouth 2 (two) times daily.  60 tablet  9  . Multiple Vitamins-Minerals ([employer]S SPECTRAVITE ADVANCED PO) Take 1 tablet by mouth daily.        Marland Kitchen  Multiple Vitamins-Minerals (OCUVITE ADULT 50+) CAPS Take 1 capsule by mouth 2 (two) times daily.        . niacin (NIASPAN) 500 MG CR tablet Take 500 mg by mouth at bedtime.        . Omega-3 Fatty Acids (FISH OIL) 1000 MG CAPS Take 1 capsule by mouth 2 (two) times daily.        . potassium chloride SA (K-DUR,KLOR-CON) 20 MEQ tablet Take 1 tablet (20 mEq total) by mouth 3 (three) times daily.  90 tablet  6  . ramipril (ALTACE) 5 MG capsule Take 1 capsule (5 mg total) by mouth 2 (two) times daily.  60 capsule  3  . spironolactone (ALDACTONE) 25 MG tablet Take 0.5 tablets (12.5 mg total) by mouth daily.  30 tablet  6  . warfarin (COUMADIN) 5 MG tablet Take 1 tablet (5 mg total) by mouth daily. UAD   45 tablet  3     Allergies  Allergen Reactions  . Penicillins   . Sulfonamide Derivatives     History   Social History  . Marital Status: Married    Spouse Name: N/A    Number of Children: 10    . Years of Education: N/A   Occupational History  .     Social History Main Topics  . Smoking status: Former Smoker -- 1.0 packs/day    Types: Cigarettes    Quit date: 07/01/1999  . Smokeless tobacco: Never Used  . Alcohol Use: No     previously drank 2 drinks and a pack of cigarettes per day but has stopped since 2001  . Drug Use: Not on file  . Sexually Active: Not on file   Other Topics Concern  . Not on file   Social History Narrative  . No narrative on file    Family History  Problem Relation Age of Onset  . Stroke Father   . Cancer Mother   . Cardiomyopathy Brother     PHYSICAL EXAM: Filed Vitals:   03/24/11 1019  BP: 100/62  Pulse: 68  Wife present Weight 136 pounds  General:  Frail appearing. No respiratory difficulty HEENT: normal Neck: supple. JVP 7-8 . Carotids 2+ bilat; no bruits. No lymphadenopathy or thryomegaly appreciated. Cor: PMI nondisplaced. Irregular rate & rhythm. S3 Lungs: CTA bilaterally  Abdomen: soft, nontender, nondistended. No hepatosplenomegaly. No bruits or masses. Good bowel sounds. Extremities: no cyanosis, clubbing, rash, no edema, compression stockings. Neuro: alert & oriented x 3, cranial nerves grossly intact. moves all 4 extremities w/o difficulty. Affect pleasant.   ASSESSMENT & PLAN:

## 2011-03-24 NOTE — Patient Instructions (Signed)
Your physician recommends that you continue on your current medications as directed. Please refer to the Current Medication list given to you today.  Your physician recommends that you continue to weigh daily, at the same time every day, and in the same amount of clothing. Please record your daily weights on the handout provided and bring it to your next appointment.  Your physician recommends that you schedule a follow-up appointment with Dr Antoine Poche on 04/11/11 at 10:45 am  Your physician recommends that you schedule a follow-up appointment in: 8 weeks with Heart Failure Clinic

## 2011-03-25 ENCOUNTER — Ambulatory Visit (INDEPENDENT_AMBULATORY_CARE_PROVIDER_SITE_OTHER): Payer: Medicare Other | Admitting: *Deleted

## 2011-03-25 DIAGNOSIS — I4891 Unspecified atrial fibrillation: Secondary | ICD-10-CM

## 2011-03-25 LAB — POCT INR: INR: 2.7

## 2011-04-11 ENCOUNTER — Ambulatory Visit (INDEPENDENT_AMBULATORY_CARE_PROVIDER_SITE_OTHER): Payer: Medicare Other | Admitting: *Deleted

## 2011-04-11 ENCOUNTER — Ambulatory Visit (INDEPENDENT_AMBULATORY_CARE_PROVIDER_SITE_OTHER): Payer: Medicare Other | Admitting: Cardiology

## 2011-04-11 ENCOUNTER — Encounter: Payer: Self-pay | Admitting: Cardiology

## 2011-04-11 DIAGNOSIS — I5022 Chronic systolic (congestive) heart failure: Secondary | ICD-10-CM

## 2011-04-11 DIAGNOSIS — I10 Essential (primary) hypertension: Secondary | ICD-10-CM

## 2011-04-11 DIAGNOSIS — I4891 Unspecified atrial fibrillation: Secondary | ICD-10-CM

## 2011-04-11 DIAGNOSIS — I255 Ischemic cardiomyopathy: Secondary | ICD-10-CM

## 2011-04-11 DIAGNOSIS — I2589 Other forms of chronic ischemic heart disease: Secondary | ICD-10-CM

## 2011-04-11 LAB — BASIC METABOLIC PANEL
GFR: 59.34 mL/min — ABNORMAL LOW (ref >60.00)
Potassium: 4.1 mEq/L (ref 3.5–5.1)
Sodium: 139 mEq/L (ref 135–145)

## 2011-04-11 NOTE — Progress Notes (Signed)
HPI The patient was formerly seen by Dr. Deborah Chalk. After my first visit with him I sent him to CHF clinic.  He is now down 15 lbs since I last saw him.  He feels much better.  He has more energy.  He is breathing better.  He is walking the dog for exercise.  Since I saw him he has had his amiodarone stopped and has had spironolactone added.  He is watching his salt closely.  The patient denies any new symptoms such as chest discomfort, neck or arm discomfort. There has been no new shortness of breath, PND or orthopnea. There have been no reported palpitations, presyncope or syncope.  His weight has been creeping up slowly but without sudden weight gain.  He has trace edema.   Allergies  Allergen Reactions  . Penicillins   . Sulfonamide Derivatives     Current Outpatient Prescriptions  Medication Sig Dispense Refill  . acetaminophen (TYLENOL) 325 MG tablet Take 650 mg by mouth as needed.        Marland Kitchen allopurinol (ZYLOPRIM) 300 MG tablet Take 300 mg by mouth daily.        Marland Kitchen amLODipine (NORVASC) 5 MG tablet Take 1 tablet (5 mg total) by mouth daily.      . digoxin (LANOXIN) 0.125 MG tablet Take 1/2 tablet by mouth once daily.      Marland Kitchen ezetimibe (ZETIA) 10 MG tablet Take 10 mg by mouth daily. 1/2 tablet       . famotidine (PEPCID) 20 MG tablet Take 20 mg by mouth daily.        . furosemide (LASIX) 80 MG tablet Take 40 mg by mouth 2 (two) times daily.       Marland Kitchen levothyroxine (SYNTHROID, LEVOTHROID) 100 MCG tablet Take 100 mcg by mouth daily.        . metolazone (ZAROXOLYN) 2.5 MG tablet Take 2.5 mg by mouth as needed.        . metoprolol (LOPRESSOR) 100 MG tablet Take 1 tablet (100 mg total) by mouth 2 (two) times daily.  60 tablet  9  . Multiple Vitamins-Minerals (CVS SPECTRAVITE ADVANCED PO) Take 1 tablet by mouth daily.        . Multiple Vitamins-Minerals (OCUVITE ADULT 50+) CAPS Take 1 capsule by mouth 2 (two) times daily.        . niacin (NIASPAN) 500 MG CR tablet Take 500 mg by mouth at bedtime.         . Omega-3 Fatty Acids (FISH OIL) 1000 MG CAPS Take 1 capsule by mouth 2 (two) times daily.        . potassium chloride SA (K-DUR,KLOR-CON) 20 MEQ tablet Take 1 tablet (20 mEq total) by mouth 3 (three) times daily.  90 tablet  6  . ramipril (ALTACE) 5 MG capsule Take 1 capsule (5 mg total) by mouth 2 (two) times daily.  60 capsule  3  . spironolactone (ALDACTONE) 25 MG tablet Take 0.5 tablets (12.5 mg total) by mouth daily.  30 tablet  6  . warfarin (COUMADIN) 5 MG tablet Take 1 tablet (5 mg total) by mouth daily. UAD   45 tablet  3    Past Medical History  Diagnosis Date  . Ischemic cardiomyopathy 02/28/00    CABG;8/11 Echo: EF=20-25% with septal akinesia and global hypokinesia and and LV systolic dysfunction.10/08 Echo: EF=35-40% with  LV dysfunction  . Paroxysmal atrial fibrillation     Referred at at time of ICD implantation-now on amiodarone  .  Peripheral vascular disease     Iliac disease and mesenteric disease  . Carotid arterial disease     Left Carotid Endartectomy  . Cardiac arrest 02/24/2000  . Hypertension   . Long term current use of anticoagulant   . History of kidney stones   . Hyperlipidemia   . Dual implantable cardiac defibrillator in situ 04/2010    Revision 06/2010 -Medtronic  . Gout   . Diabetes mellitus type II   . DJD (degenerative joint disease)   . BPH (benign prostatic hyperplasia)     Past Surgical History  Procedure Date  . Coronary artery bypass graft 2001    X4  . Cardiac defibrillator placement     ICD 04/2010  . 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  Totally occluded native cisrculation with some proximal conus collaterals to a small left anterior desending. Patent grafts.  . Transurethral resection of prostate 5/11  . Carotid endarectomy   . Fetal surgery for congenital hernia   . Left eye cataract surgery     ROS:  As stated in the HPI and negative for all other systems.  PHYSICAL EXAM BP 94/62   Pulse 82  Resp 16  Ht 6' (1.829 m)  Wt 142 lb (64.411 kg)  BMI 19.26 kg/m2 GENERAL:  Frail and chronically ill appearing HEENT:  Pupils equal round and reactive, fundi not visualized, oral mucosa unremarkable, dentures NECK:  JVD to the angel 90 degrees, carotid upstroke brisk and symmetric, no bruits, no thyromegaly LYMPHATICS:  No cervical, inguinal adenopathy LUNGS:  Clear to auscultation bilaterally BACK:  No CVA tenderness CHEST:  Unremarkable HEART:  PMI not displaced or sustained,S1 and S2 within normal limits, no S3, no S4, no clicks, no rubs, no murmurs, PMI displaced and sustained, 3/6 apical systolic murmur.   ABD:  Flat, positive bowel sounds normal in frequency in pitch, no bruits, no rebound, no guarding, no midline pulsatile mass, no hepatomegaly, no splenomegaly EXT:  2 plus pulses throughout, trace bilateral ankle edema above the knees, no cyanosis no clubbing, diffuse muscle wasting SKIN:  No rashes no nodules NEURO:  Cranial nerves II through XII grossly intact, motor grossly intact throughout PSYCH:  Cognitively intact, oriented to person place and time  ASSESSMENT AND PLAN

## 2011-04-11 NOTE — Assessment & Plan Note (Signed)
He is off amiodarone and he will continue the meds as listed.  He tolerates Coumadin.

## 2011-04-11 NOTE — Patient Instructions (Signed)
Please take extra Lasix (Furosemide 40 mg) for 2 days  Continue all other medications as listed  You are being referred to the [facility] for follow up

## 2011-04-11 NOTE — Assessment & Plan Note (Signed)
The blood pressure is at target. No change in medications is indicated. We will continue with therapeutic lifestyle changes (TLC).  

## 2011-04-11 NOTE — Assessment & Plan Note (Signed)
I think he is doing much better.  He is on board with volume management.  I will check a BMET today and give him two days of increased lasix.  I will have him follow up in the CHF clinic in two weeks and he will get an Optivol.  He can then come back to see Lawson Fiscal after that.

## 2011-04-17 ENCOUNTER — Encounter: Payer: Medicare Other | Admitting: *Deleted

## 2011-04-22 ENCOUNTER — Encounter: Payer: Medicare Other | Admitting: *Deleted

## 2011-04-23 ENCOUNTER — Encounter: Payer: Self-pay | Admitting: Internal Medicine

## 2011-04-23 ENCOUNTER — Ambulatory Visit (HOSPITAL_COMMUNITY)
Admission: RE | Admit: 2011-04-23 | Discharge: 2011-04-23 | Disposition: A | Discharge status: Home / Self Care | Payer: Medicare Other | Source: Ambulatory Visit | Attending: Internal Medicine | Admitting: Internal Medicine

## 2011-04-23 VITALS — BP 92/54 | HR 90 | Wt 146.2 lb

## 2011-04-23 DIAGNOSIS — I4891 Unspecified atrial fibrillation: Secondary | ICD-10-CM

## 2011-04-23 DIAGNOSIS — I5022 Chronic systolic (congestive) heart failure: Secondary | ICD-10-CM

## 2011-04-23 NOTE — Patient Instructions (Signed)
Please take extra 40 mg Lasix today  If your weight increase 3 pounds in 24 hours please take an extra  40mg   Lasix.   Follow up in two weeks.

## 2011-04-23 NOTE — Assessment & Plan Note (Addendum)
Continues to improve, NYHA III.  Volume status elevated. Weight increased.Optivol reviewed fluid status beginning to rise. Will give extra lasix 40mg  today. Discussed sliding scale lasix if his weight increases 3 pounds in 24 hours. He is instructed to take an extra 40 mg of Lasix .Follow up in two weeks.  Consider titrating spiro to 25 daily at that point.  Patient seen and examined with Tonye Becket, NP. We discussed all aspects of the encounter. I agree with the assessment and plan as stated above.

## 2011-04-23 NOTE — Assessment & Plan Note (Addendum)
Continues in AFib. Doing well with rate control. Off amiodarone. Tolerates Coumadin.

## 2011-04-23 NOTE — Progress Notes (Signed)
HPI:  77 year old male ischemic cardiomyopathy, chronic systolic heart failure EF 15% followed by Dr. Antoine Poche and Dr. Darcus Austin.   PMHx notable - AF s/p DC-CV 7/12, CAD s/p CABG, gout and hypothyroid. S/p BiVICD medtronic MI - LV lead turned off due to dyspnea  Initially seen by Dr. Antoine Poche and weight was up. Treated with metolazone for several days with marked response. Referred for HF clinic for further management.  His lasix was decreased from 80/40 to 40/40 due to orthostasis.    He returns for follow up today.  He continues to weigh himself daily and 138- 42  He has taken two extra Lasix.  He continues to walk with his dog and is able to go several blocks without difficulty.  He climbs stairs at home without much difficulty.  Continues to sleep on 1 pillow. Occasionally at use O2 at night.  He does not use O2 during the day.  He denies CP, orthopnea, PND, edema or dyspnea. He denies dizziness which is an improvement.    ROS: All other systems normal except as mentioned in HPI, past medical history and problem list.    Past Medical History  Diagnosis Date  . Ischemic cardiomyopathy 02/28/00    CABG;8/11 Echo: EF=20-25% with septal akinesia and global hypokinesia and and LV systolic dysfunction.10/08 Echo: EF=35-40% with  LV dysfunction  . Paroxysmal atrial fibrillation     Referred at at time of ICD implantation-now on amiodarone  . Peripheral vascular disease     Iliac disease and mesenteric disease  . Carotid arterial disease     Left Carotid Endartectomy  . Cardiac arrest 02/24/2000  . Hypertension   . Long term current use of anticoagulant   . History of kidney stones   . Hyperlipidemia   . Dual implantable cardiac defibrillator in situ 04/2010    Revision 06/2010 -Medtronic  . Gout   . Diabetes mellitus type II   . DJD (degenerative joint disease)   . BPH (benign prostatic hyperplasia)     Current Outpatient Prescriptions  Medication Sig Dispense Refill  .  acetaminophen (TYLENOL) 325 MG tablet Take 650 mg by mouth as needed.        Marland Kitchen allopurinol (ZYLOPRIM) 300 MG tablet Take 300 mg by mouth daily.        Marland Kitchen amLODipine (NORVASC) 5 MG tablet Take 1 tablet (5 mg total) by mouth daily.      . digoxin (LANOXIN) 0.125 MG tablet Take 1/2 tablet by mouth once daily.      Marland Kitchen ezetimibe (ZETIA) 10 MG tablet Take 10 mg by mouth daily. 1/2 tablet       . famotidine (PEPCID) 20 MG tablet Take 20 mg by mouth daily.        . furosemide (LASIX) 80 MG tablet Take 40 mg by mouth 2 (two) times daily.       . hydroxypropyl methylcellulose (ISOPTO TEARS) 2.5 % ophthalmic solution Place 1 drop into both eyes 2 (two) times daily as needed. Opti fresh eye drops       . levothyroxine (SYNTHROID, LEVOTHROID) 100 MCG tablet Take 100 mcg by mouth daily.        . metoprolol (LOPRESSOR) 100 MG tablet Take 1 tablet (100 mg total) by mouth 2 (two) times daily.  60 tablet  9  . Multiple Vitamins-Minerals ([employer]S SPECTRAVITE ADVANCED PO) Take 1 tablet by mouth daily.        . Multiple Vitamins-Minerals (OCUVITE ADULT 50+) CAPS Take  1 capsule by mouth 2 (two) times daily.        . niacin (NIASPAN) 500 MG CR tablet Take 500 mg by mouth at bedtime.        . Omega-3 Fatty Acids (FISH OIL) 1000 MG CAPS Take 1 capsule by mouth 2 (two) times daily.        . potassium chloride SA (K-DUR,KLOR-CON) 20 MEQ tablet Take 1 tablet (20 mEq total) by mouth 3 (three) times daily.  90 tablet  6  . ramipril (ALTACE) 5 MG capsule Take 1 capsule (5 mg total) by mouth 2 (two) times daily.  60 capsule  3  . spironolactone (ALDACTONE) 25 MG tablet Take 0.5 tablets (12.5 mg total) by mouth daily.  30 tablet  6  . warfarin (COUMADIN) 5 MG tablet Take 1 tablet (5 mg total) by mouth daily. UAD   45 tablet  3  . metolazone (ZAROXOLYN) 2.5 MG tablet Take 2.5 mg by mouth as needed.        Marland Kitchen DISCONTD: metolazone (ZAROXOLYN) 2.5 MG tablet Take 1 tablet (2.5 mg total) by mouth daily.  30 tablet  0     Allergies    Allergen Reactions  . Penicillins   . Sulfonamide Derivatives     History   Social History  . Marital Status: Married    Spouse Name: N/A    Number of Children: 10  . Years of Education: N/A   Occupational History  .     Social History Main Topics  . Smoking status: Former Smoker -- 1.0 packs/day    Types: Cigarettes    Quit date: 07/01/1999  . Smokeless tobacco: Never Used  . Alcohol Use: No     previously drank 2 drinks and a pack of cigarettes per day but has stopped since 2001  . Drug Use: Not on file  . Sexually Active: Not on file   Other Topics Concern  . Not on file   Social History Narrative  . No narrative on file    Family History  Problem Relation Age of Onset  . Stroke Father   . Cancer Mother   . Cardiomyopathy Brother     PHYSICAL EXAM: Filed Vitals:   04/23/11 1105  BP: 92/54  Pulse: 90  Wife present Weight 136 pounds  General:  Frail appearing. No respiratory difficulty HEENT: normal Neck: supple. JVP 9-10  Carotids 2+ bilat; no bruits. No lymphadenopathy or thryomegaly appreciated. Cor: PMI nondisplaced. Irregular rate & rhythm. S3 Lungs: CTA bilaterally  Abdomen: soft, nontender, nondistended. No hepatosplenomegaly. No bruits or masses. Good bowel sounds. Extremities: no cyanosis, clubbing, rash, no edema, compression stockings. Neuro: alert & oriented x 3, cranial nerves grossly intact. moves all 4 extremities w/o difficulty. Affect pleasant.   ASSESSMENT & PLAN:

## 2011-04-24 ENCOUNTER — Ambulatory Visit (HOSPITAL_COMMUNITY): Payer: Medicare Other

## 2011-04-27 NOTE — Progress Notes (Signed)
Patient seen and examined with Tonye Becket, NP and Rusty Aus, PA-C. We discussed all aspects of the encounter. I agree with the assessment and plan as stated above.

## 2011-05-01 ENCOUNTER — Other Ambulatory Visit: Payer: Self-pay | Admitting: Internal Medicine

## 2011-05-01 ENCOUNTER — Encounter: Payer: Self-pay | Admitting: Internal Medicine

## 2011-05-01 ENCOUNTER — Ambulatory Visit (INDEPENDENT_AMBULATORY_CARE_PROVIDER_SITE_OTHER): Payer: Medicare Other | Admitting: *Deleted

## 2011-05-01 DIAGNOSIS — I5022 Chronic systolic (congestive) heart failure: Secondary | ICD-10-CM

## 2011-05-01 DIAGNOSIS — Z95810 Presence of automatic (implantable) cardiac defibrillator: Secondary | ICD-10-CM

## 2011-05-01 DIAGNOSIS — I2589 Other forms of chronic ischemic heart disease: Secondary | ICD-10-CM

## 2011-05-01 DIAGNOSIS — I4891 Unspecified atrial fibrillation: Secondary | ICD-10-CM

## 2011-05-04 LAB — REMOTE ICD DEVICE
AL AMPLITUDE: 0.5 mv
BAMS-0001: 170 {beats}/min
FVT: 0
RV LEAD AMPLITUDE: 14.625 mv
RV LEAD IMPEDENCE ICD: 380 Ohm
RV LEAD THRESHOLD: 0.875 V
TOT-0001: 0
TZAT-0001ATACH: 2
TZAT-0001ATACH: 3
TZAT-0001FASTVT: 1
TZAT-0001SLOWVT: 1
TZAT-0002ATACH: NEGATIVE
TZAT-0002ATACH: NEGATIVE
TZAT-0004SLOWVT: 8
TZAT-0011SLOWVT: 10 ms
TZAT-0012ATACH: 150 ms
TZAT-0012ATACH: 150 ms
TZAT-0012ATACH: 150 ms
TZAT-0012FASTVT: 200 ms
TZAT-0013SLOWVT: 3
TZAT-0018ATACH: NEGATIVE
TZAT-0018FASTVT: NEGATIVE
TZAT-0018SLOWVT: NEGATIVE
TZAT-0019SLOWVT: 8 V
TZAT-0020ATACH: 1.5 ms
TZON-0003ATACH: 350 ms
TZON-0003SLOWVT: 350 ms
TZON-0003VSLOWVT: 400 ms
TZON-0004VSLOWVT: 20
TZST-0001ATACH: 4
TZST-0001ATACH: 6
TZST-0001FASTVT: 2
TZST-0001FASTVT: 4
TZST-0001FASTVT: 5
TZST-0001FASTVT: 6
TZST-0001SLOWVT: 3
TZST-0001SLOWVT: 5
TZST-0002ATACH: NEGATIVE
TZST-0002FASTVT: NEGATIVE
TZST-0003SLOWVT: 35 J
TZST-0003SLOWVT: 35 J
TZST-0003SLOWVT: 35 J
VF: 0

## 2011-05-06 ENCOUNTER — Telehealth (HOSPITAL_COMMUNITY): Payer: Self-pay | Admitting: *Deleted

## 2011-05-06 ENCOUNTER — Encounter (HOSPITAL_COMMUNITY): Payer: Self-pay

## 2011-05-06 ENCOUNTER — Ambulatory Visit (HOSPITAL_COMMUNITY)
Admission: RE | Admit: 2011-05-06 | Discharge: 2011-05-06 | Disposition: A | Discharge status: Home / Self Care | Payer: Medicare Other | Source: Ambulatory Visit | Attending: Internal Medicine | Admitting: Internal Medicine

## 2011-05-06 ENCOUNTER — Ambulatory Visit (HOSPITAL_COMMUNITY)
Admission: RE | Admit: 2011-05-06 | Discharge: 2011-05-06 | Disposition: A | Discharge status: Home / Self Care | Payer: Medicare Other | Source: Ambulatory Visit | Attending: Physician Assistant | Admitting: Physician Assistant

## 2011-05-06 VITALS — BP 86/50 | HR 88 | Wt 144.8 lb

## 2011-05-06 DIAGNOSIS — R0609 Other forms of dyspnea: Secondary | ICD-10-CM | POA: Insufficient documentation

## 2011-05-06 DIAGNOSIS — R0989 Other specified symptoms and signs involving the circulatory and respiratory systems: Secondary | ICD-10-CM | POA: Insufficient documentation

## 2011-05-06 DIAGNOSIS — I5022 Chronic systolic (congestive) heart failure: Secondary | ICD-10-CM | POA: Insufficient documentation

## 2011-05-06 DIAGNOSIS — R0600 Dyspnea, unspecified: Secondary | ICD-10-CM

## 2011-05-06 LAB — BASIC METABOLIC PANEL
Chloride: 102 mEq/L (ref 96–112)
GFR calc Af Amer: 70 mL/min — ABNORMAL LOW (ref >90)
Potassium: 5.4 mEq/L — ABNORMAL HIGH (ref 3.5–5.1)

## 2011-05-06 LAB — PRO B NATRIURETIC PEPTIDE: Pro B Natriuretic peptide (BNP): 11090 pg/mL — ABNORMAL HIGH (ref 0–450)

## 2011-05-06 MED ORDER — POTASSIUM CHLORIDE CRYS ER 20 MEQ PO TBCR: 20.0000 meq | EXTENDED_RELEASE_TABLET | Freq: Two times a day (BID) | ORAL | Status: DC

## 2011-05-06 MED ORDER — MOXIFLOXACIN HCL 400 MG PO TABS: 400.0000 mg | ORAL_TABLET | Freq: Every day | ORAL | Status: AC

## 2011-05-06 NOTE — Telephone Encounter (Signed)
Addended by: Noralee Space on: 05/06/2011 04:25 PM   Modules accepted: Orders

## 2011-05-06 NOTE — Telephone Encounter (Signed)
Pt's wife aware, rx sent in 

## 2011-05-06 NOTE — Telephone Encounter (Signed)
Message copied by Noralee Space on Tue May 06, 2011  4:22 PM ------      Message from: Hadassah Pais      Created: Tue May 06, 2011  3:37 PM       Add Avelox 400 mg daily x7 days for possible infiltrate (?aspiration)

## 2011-05-06 NOTE — Patient Instructions (Signed)
Take Metolazone 2.5 mg today only.  Will get labs and chest x-ray today.  Continue to weigh yourself daily and record weights.  Call if dizziness or lightheadedness increase, fever/chills or coughing up colored sputum.  Follow up in 3 weeks with Dr. Gala Romney.

## 2011-05-06 NOTE — Assessment & Plan Note (Addendum)
Functional status stable but volume status remains elevated.  NYHA III.  Will give metolazone 2.5 mg today and continue lasix 40 mg BID.  Continue sliding scale lasix for weight increase greater than 3 pounds in 24 hours.  Check BMET and BNP today as well as a CXR.  Will call with results and further management.  Unable to titrate meds at this time with soft BP.  Follow up in 3 weeks.   Patient seen and examined with Ulyess Blossom PA-C. We discussed all aspects of the encounter. I agree with the assessment and plan as stated above.

## 2011-05-06 NOTE — Progress Notes (Signed)
HPI:  77 year old male ischemic cardiomyopathy, chronic systolic heart failure EF 15% followed by Dr. Antoine Poche and Dr. Darcus Austin.   PMHx notable - AF s/p DC-CV 7/12, CAD s/p CABG, gout and hypothyroid. S/p BiVICD medtronic MI - LV lead turned off due to dyspnea  Initially seen by Dr. Antoine Poche and weight was up. Treated with metolazone for several days with marked response. Referred for HF clinic for further management.  His lasix was decreased from 80/40 to 40/40 due to orthostasis.  Volumes status mildly elevated last visit and lasix doubled for 1 day.  He is in for follow up today.  He feels good.  He is weighing himself daily and remains 142-143.  Walking slacked off a little with the cold weather but gets out when it's a little warmer. Uses O2 at night, no orthopnea or PND.  SBP 88-96 at home.  Occasional postural hypotension.  No presyncope or syncope.  No problems with walking up steps at home.  No CP.  +cough with clear foamy sputum.    ROS: All other systems normal except as mentioned in HPI, past medical history and problem list.    Past Medical History  Diagnosis Date  . Ischemic cardiomyopathy 02/28/00    CABG;8/11 Echo: EF=20-25% with septal akinesia and global hypokinesia and and LV systolic dysfunction.10/08 Echo: EF=35-40% with  LV dysfunction  . Paroxysmal atrial fibrillation     Referred at at time of ICD implantation-now on amiodarone  . Peripheral vascular disease     Iliac disease and mesenteric disease  . Carotid arterial disease     Left Carotid Endartectomy  . Cardiac arrest 02/24/2000  . Hypertension   . Long term current use of anticoagulant   . History of kidney stones   . Hyperlipidemia   . Dual implantable cardiac defibrillator in situ 04/2010    Revision 06/2010 -Medtronic  . Gout   . Diabetes mellitus type II   . DJD (degenerative joint disease)   . BPH (benign prostatic hyperplasia)     Current Outpatient Prescriptions  Medication Sig Dispense  Refill  . acetaminophen (TYLENOL) 325 MG tablet Take 650 mg by mouth as needed.        Marland Kitchen allopurinol (ZYLOPRIM) 300 MG tablet Take 300 mg by mouth daily.        Marland Kitchen amLODipine (NORVASC) 5 MG tablet Take 1 tablet (5 mg total) by mouth daily.      . digoxin (LANOXIN) 0.125 MG tablet Take 1/2 tablet by mouth once daily.      Marland Kitchen ezetimibe (ZETIA) 10 MG tablet Take 10 mg by mouth daily. 1/2 tablet       . famotidine (PEPCID) 20 MG tablet Take 20 mg by mouth daily.        . hydroxypropyl methylcellulose (ISOPTO TEARS) 2.5 % ophthalmic solution Place 1 drop into both eyes 2 (two) times daily as needed. Opti fresh eye drops       . levothyroxine (SYNTHROID, LEVOTHROID) 100 MCG tablet Take 100 mcg by mouth daily.        . metolazone (ZAROXOLYN) 2.5 MG tablet Take 2.5 mg by mouth as needed.        . metoprolol (LOPRESSOR) 100 MG tablet Take 1 tablet (100 mg total) by mouth 2 (two) times daily.  60 tablet  9  . Multiple Vitamins-Minerals (CVS SPECTRAVITE ADVANCED PO) Take 1 tablet by mouth daily.        . Multiple Vitamins-Minerals (OCUVITE ADULT 50+) CAPS  Take 1 capsule by mouth 2 (two) times daily.        . niacin (NIASPAN) 500 MG CR tablet Take 500 mg by mouth at bedtime.        . Omega-3 Fatty Acids (FISH OIL) 1000 MG CAPS Take 1 capsule by mouth 2 (two) times daily.        . potassium chloride SA (K-DUR,KLOR-CON) 20 MEQ tablet Take 1 tablet (20 mEq total) by mouth 3 (three) times daily.  90 tablet  6  . ramipril (ALTACE) 5 MG capsule Take 1 capsule (5 mg total) by mouth 2 (two) times daily.  60 capsule  3  . spironolactone (ALDACTONE) 25 MG tablet Take 0.5 tablets (12.5 mg total) by mouth daily.  30 tablet  6  . warfarin (COUMADIN) 5 MG tablet Take 1 tablet (5 mg total) by mouth daily. UAD   45 tablet  3  . DISCONTD: metolazone (ZAROXOLYN) 2.5 MG tablet Take 1 tablet (2.5 mg total) by mouth daily.  30 tablet  0     Allergies  Allergen Reactions  . Penicillins   . Sulfonamide Derivatives      History   Social History  . Marital Status: Married    Spouse Name: N/A    Number of Children: 10  . Years of Education: N/A   Occupational History  .     Social History Main Topics  . Smoking status: Former Smoker -- 1.0 packs/day    Types: Cigarettes    Quit date: 07/01/1999  . Smokeless tobacco: Never Used  . Alcohol Use: No     previously drank 2 drinks and a pack of cigarettes per day but has stopped since 2001  . Drug Use: Not on file  . Sexually Active: Not on file   Other Topics Concern  . Not on file   Social History Narrative  . No narrative on file    Family History  Problem Relation Age of Onset  . Stroke Father   . Cancer Mother   . Cardiomyopathy Brother     PHYSICAL EXAM: Filed Vitals:   05/06/11 1059  BP: 86/50  Pulse: 88  Wt 144  General:  Frail appearing. No respiratory difficulty HEENT: normal Neck: supple. JVP 9-10, +HJR  Carotids 2+ bilat; no bruits. No lymphadenopathy or thryomegaly appreciated. Cor: PMI nondisplaced. Irregular rate & rhythm. S3 Lungs: decreased BS, + crackles in LLL,  Abdomen: soft, nontender, nondistended. Liver edge palpable (2-3 cm). No bruits or masses. Good bowel sounds. Extremities: no cyanosis, clubbing, rash, no edema, compression stockings. Neuro: alert & oriented x 3, cranial nerves grossly intact. moves all 4 extremities w/o difficulty. Affect pleasant.   ASSESSMENT & PLAN:

## 2011-05-07 ENCOUNTER — Telehealth: Payer: Self-pay | Admitting: Physician Assistant

## 2011-05-07 NOTE — Telephone Encounter (Signed)
Spoke with patient, he is down 4-5 pounds today after taking metolazone yesterday.  Feels well besides cough.  He is starting his Avelox today.  He will call if symptoms worsen or fever/chills develop.

## 2011-05-08 ENCOUNTER — Ambulatory Visit (HOSPITAL_COMMUNITY): Payer: Medicare Other

## 2011-05-08 ENCOUNTER — Encounter: Payer: Self-pay | Admitting: *Deleted

## 2011-05-09 ENCOUNTER — Ambulatory Visit (INDEPENDENT_AMBULATORY_CARE_PROVIDER_SITE_OTHER): Payer: Medicare Other | Admitting: *Deleted

## 2011-05-09 DIAGNOSIS — I4891 Unspecified atrial fibrillation: Secondary | ICD-10-CM

## 2011-05-16 NOTE — Progress Notes (Signed)
icd remote w/icm  

## 2011-05-20 ENCOUNTER — Other Ambulatory Visit: Payer: Self-pay | Admitting: Cardiology

## 2011-05-22 NOTE — Progress Notes (Signed)
Patient seen and examined with Nicki Bradley PA-C. We discussed all aspects of the encounter. I agree with the assessment and plan as stated below.   

## 2011-05-26 ENCOUNTER — Encounter (HOSPITAL_COMMUNITY): Payer: Medicare Other

## 2011-05-27 ENCOUNTER — Telehealth (HOSPITAL_COMMUNITY): Payer: Self-pay | Admitting: *Deleted

## 2011-05-27 NOTE — Telephone Encounter (Signed)
Have tried to call pt several times this afternoon and get a busy signal, will continue to try and reach pt

## 2011-05-27 NOTE — Telephone Encounter (Signed)
Spoke w/pt's wife, she is a little concerned about him she states he has a little swelling in his feet but wt is pretty stable.  She states he sounds very congested in his chest and his coughing up clear phlegm, she thinks he has a cold.  Advised to go ahead and take extra lasix since he does have swelling and to get OTC Robitussin and Mucinex she is agreeable and will do this, she will call me back if she thinks he needs to be seen before Friday

## 2011-05-27 NOTE — Telephone Encounter (Signed)
Mrs Hodapp called this am regarding her husband, he has an appointment on Friday at the clinic, however he has developed a congestive cough and has some rattling in his chest.  Mrs Lauf would like a call back.  Thanks

## 2011-05-30 ENCOUNTER — Ambulatory Visit (HOSPITAL_COMMUNITY)
Admission: RE | Admit: 2011-05-30 | Discharge: 2011-05-30 | Disposition: A | Discharge status: Home / Self Care | Payer: Medicare Other | Source: Ambulatory Visit | Attending: Internal Medicine | Admitting: Internal Medicine

## 2011-05-30 ENCOUNTER — Other Ambulatory Visit: Payer: Self-pay

## 2011-05-30 VITALS — BP 82/54 | HR 88 | Wt 146.8 lb

## 2011-05-30 DIAGNOSIS — I5022 Chronic systolic (congestive) heart failure: Secondary | ICD-10-CM | POA: Insufficient documentation

## 2011-05-30 DIAGNOSIS — I959 Hypotension, unspecified: Secondary | ICD-10-CM | POA: Insufficient documentation

## 2011-05-30 LAB — BASIC METABOLIC PANEL
GFR calc Af Amer: 76 mL/min — ABNORMAL LOW (ref >90)
GFR calc non Af Amer: 66 mL/min — ABNORMAL LOW (ref >90)
Potassium: 5.2 mEq/L — ABNORMAL HIGH (ref 3.5–5.1)
Sodium: 141 mEq/L (ref 135–145)

## 2011-05-30 MED ORDER — METOPROLOL TARTRATE 100 MG PO TABS: 50.0000 mg | ORAL_TABLET | Freq: Two times a day (BID) | ORAL | Status: DC

## 2011-05-30 MED ORDER — RAMIPRIL 2.5 MG PO CAPS: 2.5000 mg | ORAL_CAPSULE | Freq: Two times a day (BID) | ORAL | Status: DC

## 2011-05-30 NOTE — Assessment & Plan Note (Addendum)
Suspect due to low output but currently asymptomatic.  Will stop norvasc and decrease lopressor/altace in order to diurese him. Continue to check daily.

## 2011-05-30 NOTE — Progress Notes (Signed)
Patient seen and examined with Nicki Bradley PA-C. We discussed all aspects of the encounter. I agree with the assessment and plan as stated below.   

## 2011-05-30 NOTE — Patient Instructions (Addendum)
Take metolazone 2.5 mg daily for two days then start 1 tab every Friday.   Stop amlodipine (norvasc).  Decrease lopressor to 50 mg (0.5 tab) twice daily.  Decrease ramipril 2.5 mg twice daily.  Labs today.  Follow up 7-10 days.

## 2011-05-30 NOTE — Assessment & Plan Note (Signed)
Continues in AFib. Doing well with rate control. Continue coumadin.

## 2011-05-30 NOTE — Assessment & Plan Note (Addendum)
Presents today with elevated volume status.  NYHA III-IIIb. Says he feels ok but wife says sleeping 12+ hours/day.  Will need to give metolazone 2.5 mg x2 days and every Friday.  With hypotension will stop amlodipine, decrease lopressor 50 mg BID, and decrease ramipril 2.5 mg BID.  Would like his weight around 142-143.  Will check BMET today and have close follow up in 7 days.  Optivol tracing reviewed and confirms mild to moderate fluid overload.   This is a difficult situation and feel his heart may be getting weaker.  Had a long discission with him and his wife today concerning the next year.  Advanced therapies and end of life issues reviewed.  He is not a candidate for transplant or LVAD.  Inotropic therapy discussed but he does not feel strongly about this.  Would like to manage symptoms at home right now as he does not feel poorly, he is not interested in palliative care.  He does want to be a DNR/DNI.    Patient seen and examined with Ulyess Blossom PA-C. We discussed all aspects of the encounter. I agree with the assessment and plan as stated above.  Extensive discussion with Mr. Prettyman and his wife about treatment options and goals of care. I told him I expect his life expectancy to be < 1 year. He is not interested in, nor is he a good candidate for advanced therapies. Does want to be DNR/DNI. Wife also fatigued and may need caretaker help at some point.   Total MD time spent = 50 mins with over 70% of that time dedicated to counseling and discussions described above.

## 2011-05-30 NOTE — Progress Notes (Signed)
HPI:  77 year old male ischemic cardiomyopathy, chronic systolic heart failure EF 15% followed by Dr. Antoine Poche and Dr. Darcus Austin.   PMHx notable - AF s/p DC-CV 7/12, CAD s/p CABG, gout and hypothyroid. S/p BiVICD medtronic MI - LV lead turned off due to dyspnea  Initially seen by Dr. Antoine Poche and weight was up. Treated with metolazone for several days with marked response. Referred for HF clinic for further management.  His lasix was decreased from 80/40 to 40/40 due to orthostasis.  Volumes status mildly elevated last visit and lasix doubled for 1 day.  Returns for follow up today.  Clear sputum/thick.  Cough improved after robitussin. Took a metolazone last week after seeing Dr. Darcus Austin, weight dropped 2 lbs to 145  Breathing is good.  No orthopnea/PND.  Weight up 144-148.  SBP 90s at home.  Occ dizziness with standing.  No syncope.  Slipped off of toiled seat trying to put on compression hose.  No bleeding.  Feeding dog without difficulty.  No fevers. Sleeping 12 hours a day.   ROS: All other systems normal except as mentioned in HPI, past medical history and problem list.    Past Medical History  Diagnosis Date  . Ischemic cardiomyopathy 02/28/00    CABG;8/11 Echo: EF=20-25% with septal akinesia and global hypokinesia and and LV systolic dysfunction.10/08 Echo: EF=35-40% with  LV dysfunction  . Paroxysmal atrial fibrillation     Referred at at time of ICD implantation-now on amiodarone  . Peripheral vascular disease     Iliac disease and mesenteric disease  . Carotid arterial disease     Left Carotid Endartectomy  . Cardiac arrest 02/24/2000  . Hypertension   . Long term current use of anticoagulant   . History of kidney stones   . Hyperlipidemia   . Dual implantable cardiac defibrillator in situ 04/2010    Revision 06/2010 -Medtronic  . Gout   . Diabetes mellitus type II   . DJD (degenerative joint disease)   . BPH (benign prostatic hyperplasia)     Current Outpatient  Prescriptions  Medication Sig Dispense Refill  . acetaminophen (TYLENOL) 325 MG tablet Take 650 mg by mouth 2 (two) times daily as needed.       Marland Kitchen allopurinol (ZYLOPRIM) 300 MG tablet Take 300 mg by mouth daily.        Marland Kitchen amLODipine (NORVASC) 5 MG tablet Take 1 tablet (5 mg total) by mouth daily.      . digoxin (LANOXIN) 0.125 MG tablet Take 1/2 tablet by mouth once daily.      Marland Kitchen ezetimibe (ZETIA) 10 MG tablet Take 5 mg by mouth daily. 1/2 tablet      . famotidine (PEPCID) 20 MG tablet Take 20 mg by mouth daily.        . furosemide (LASIX) 40 MG tablet Take 40 mg by mouth 2 (two) times daily.        . hydroxypropyl methylcellulose (ISOPTO TEARS) 2.5 % ophthalmic solution Place 1 drop into both eyes 2 (two) times daily as needed. Opti fresh eye drops       . levothyroxine (SYNTHROID, LEVOTHROID) 100 MCG tablet Take 100 mcg by mouth daily.        . metolazone (ZAROXOLYN) 2.5 MG tablet Take 2.5 mg by mouth as needed.        . metoprolol (LOPRESSOR) 100 MG tablet Take 1 tablet (100 mg total) by mouth 2 (two) times daily.  60 tablet  9  . Multiple Vitamins-Minerals (CVS  SPECTRAVITE ADVANCED PO) Take 1 tablet by mouth daily.        . Multiple Vitamins-Minerals (OCUVITE ADULT 50+) CAPS Take 1 capsule by mouth 2 (two) times daily.        . niacin (NIASPAN) 500 MG CR tablet Take 500 mg by mouth at bedtime.        . Omega-3 Fatty Acids (FISH OIL) 1000 MG CAPS Take 1 capsule by mouth 2 (two) times daily.        . potassium chloride SA (K-DUR,KLOR-CON) 20 MEQ tablet Take 1 tablet (20 mEq total) by mouth 2 (two) times daily.  90 tablet  6  . ramipril (ALTACE) 5 MG capsule Take 1 capsule (5 mg total) by mouth 2 (two) times daily.  60 capsule  3  . spironolactone (ALDACTONE) 25 MG tablet Take 0.5 tablets (12.5 mg total) by mouth daily.  30 tablet  6  . warfarin (COUMADIN) 5 MG tablet Take as directed by anticoagulation clinic  45 tablet  3  . DISCONTD: metolazone (ZAROXOLYN) 2.5 MG tablet Take 1 tablet (2.5 mg  total) by mouth daily.  30 tablet  0     Allergies  Allergen Reactions  . Penicillins   . Sulfonamide Derivatives     History   Social History  . Marital Status: Married    Spouse Name: N/A    Number of Children: 10  . Years of Education: N/A   Occupational History  .     Social History Main Topics  . Smoking status: Former Smoker -- 1.0 packs/day    Types: Cigarettes    Quit date: 07/01/1999  . Smokeless tobacco: Never Used  . Alcohol Use: No     previously drank 2 drinks and a pack of cigarettes per day but has stopped since 2001  . Drug Use: Not on file  . Sexually Active: Not on file   Other Topics Concern  . Not on file   Social History Narrative  . No narrative on file    Family History  Problem Relation Age of Onset  . Stroke Father   . Cancer Mother   . Cardiomyopathy Brother     PHYSICAL EXAM: Filed Vitals:   05/30/11 0943  BP: 82/54  Pulse: 88  Wt 146 (up 2 lbs)  General:  Frail appearing. No respiratory difficulty HEENT: normal Neck: supple. JVP 11-12, +HJR  Carotids 2+ bilat; no bruits. No lymphadenopathy or thryomegaly appreciated. Cor: PMI nondisplaced. Iregular rate & rhythm. S3 Lungs: decreased BS on R, + crackles/rhonchi L>R  Abdomen: soft, nontender, nondistended. Liver edge palpable (2-3 cm). No bruits or masses. Good bowel sounds. Extremities: no cyanosis, clubbing, rash, 2+ edema, compression stockings. Neuro: alert & oriented x 3, cranial nerves grossly intact. moves all 4 extremities w/o difficulty. Affect pleasant.  Optival tracing today shows increase fluid since the first of November but it has not crossed threshold in the past year.  Activity level remains around 1 hour. EKG:  Afib at 83 bpm with PVCs.    ASSESSMENT & PLAN:

## 2011-05-31 NOTE — Progress Notes (Signed)
Encounter addended by: Sanda Linger on: 05/31/2011 10:34 AM<BR>     Documentation filed: Charges VN

## 2011-06-06 ENCOUNTER — Encounter (HOSPITAL_COMMUNITY): Payer: Medicare Other

## 2011-06-06 ENCOUNTER — Ambulatory Visit (INDEPENDENT_AMBULATORY_CARE_PROVIDER_SITE_OTHER): Payer: Medicare Other | Admitting: *Deleted

## 2011-06-06 DIAGNOSIS — I4891 Unspecified atrial fibrillation: Secondary | ICD-10-CM

## 2011-06-09 ENCOUNTER — Ambulatory Visit (HOSPITAL_COMMUNITY)
Admission: RE | Admit: 2011-06-09 | Discharge: 2011-06-09 | Disposition: A | Discharge status: Home / Self Care | Payer: Medicare Other | Source: Ambulatory Visit | Attending: Adult Health | Admitting: Adult Health

## 2011-06-09 ENCOUNTER — Ambulatory Visit (HOSPITAL_COMMUNITY)
Admission: RE | Admit: 2011-06-09 | Discharge: 2011-06-09 | Disposition: A | Discharge status: Home / Self Care | Payer: Medicare Other | Source: Ambulatory Visit | Attending: Internal Medicine | Admitting: Internal Medicine

## 2011-06-09 VITALS — BP 104/62 | HR 87 | Wt 136.2 lb

## 2011-06-09 DIAGNOSIS — I5022 Chronic systolic (congestive) heart failure: Secondary | ICD-10-CM

## 2011-06-09 DIAGNOSIS — J90 Pleural effusion, not elsewhere classified: Secondary | ICD-10-CM | POA: Insufficient documentation

## 2011-06-09 LAB — BASIC METABOLIC PANEL
BUN: 24 mg/dL — ABNORMAL HIGH (ref 6–23)
GFR calc Af Amer: 90 mL/min — ABNORMAL LOW (ref >90)
GFR calc non Af Amer: 77 mL/min — ABNORMAL LOW (ref >90)
Potassium: 2.7 mEq/L — CL (ref 3.5–5.1)
Sodium: 140 mEq/L (ref 135–145)

## 2011-06-09 NOTE — Patient Instructions (Addendum)
Follow-up in 4 weeks

## 2011-06-09 NOTE — Assessment & Plan Note (Addendum)
NYHA IIIB. Volume status stable. Discussed long term symptom management with hospice services. He and his wife agreeable to Hospice agreeable. Repeat CXR due to ongoing productive cough. CXR improved aeration noted. Repeat BMET today.   Patient seen and examined with Tonye Becket, NP. We discussed all aspects of the encounter. I agree with the assessment and plan as stated above. He continues to have progressive decline with increasing fatigue. We had long discussion about poor prognosis and benefits of involving Hospice. They are amenable to this and we will make referral.   Total MD time spent = 45 mins with over 70% of that time dedicated to counseling and discussions described above.

## 2011-06-09 NOTE — Progress Notes (Signed)
Patient ID: Ian Williams, male   DOB: 1931-05-22, 77 y.o.   MRN: [id number]  HPI:  77 year old male ischemic cardiomyopathy, chronic systolic heart failure EF 15% followed by Dr. Antoine Poche and Dr. Darcus Austin.   PMHx notable - AF s/p DC-CV 7/12, CAD s/p CABG, gout and hypothyroid. S/p BiVICD medtronic MI - LV lead turned off due to dyspnea  Initially seen by Dr. Antoine Poche and weight was up. Treated with metolazone for several days with marked response. Referred for HF clinic for further management.  His lasix was decreased from 80/40 to 40/40 due to orthostasis.  Volumes status mildly elevated last visit and lasix doubled for 1 day.    Returns for follow up today. Last visit Metolazone once week started.  Weight at home 136. Decreased 10 pounds.   Cough persists. Did take 1 week antibiotics cough.  No orthopnea/PND. Denies dizziness. SBP 90-100.   Continues to wear compression stocking.  Appetite poor per wife. Denies fever/ chills. Productive cough clear/green. Continues to sleep majority of the day.   ROS: All other systems normal except as mentioned in HPI, past medical history and problem list.    Past Medical History  Diagnosis Date  . Ischemic cardiomyopathy 02/28/00    CABG;8/11 Echo: EF=20-25% with septal akinesia and global hypokinesia and and LV systolic dysfunction.10/08 Echo: EF=35-40% with  LV dysfunction  . Paroxysmal atrial fibrillation     Referred at at time of ICD implantation-now on amiodarone  . Peripheral vascular disease     Iliac disease and mesenteric disease  . Carotid arterial disease     Left Carotid Endartectomy  . Cardiac arrest 02/24/2000  . Hypertension   . Long term current use of anticoagulant   . History of kidney stones   . Hyperlipidemia   . Dual implantable cardiac defibrillator in situ 04/2010    Revision 06/2010 -Medtronic  . Gout   . Diabetes mellitus type II   . DJD (degenerative joint disease)   . BPH (benign prostatic hyperplasia)     Current  Outpatient Prescriptions  Medication Sig Dispense Refill  . acetaminophen (TYLENOL) 325 MG tablet Take 650 mg by mouth 2 (two) times daily as needed.       Marland Kitchen allopurinol (ZYLOPRIM) 300 MG tablet Take 300 mg by mouth daily.        . digoxin (LANOXIN) 0.125 MG tablet Take 1/2 tablet by mouth once daily.      Marland Kitchen ezetimibe (ZETIA) 10 MG tablet Take 5 mg by mouth daily. 1/2 tablet      . famotidine (PEPCID) 20 MG tablet Take 20 mg by mouth daily.        . furosemide (LASIX) 40 MG tablet Take 40 mg by mouth 2 (two) times daily.        . hydroxypropyl methylcellulose (ISOPTO TEARS) 2.5 % ophthalmic solution Place 1 drop into both eyes 2 (two) times daily as needed. Opti fresh eye drops       . levothyroxine (SYNTHROID, LEVOTHROID) 100 MCG tablet Take 100 mcg by mouth daily. Take 1 tablet everyday except on Wednesdays and Fridays take 1.5 tablets      . metolazone (ZAROXOLYN) 2.5 MG tablet Take 2.5 mg by mouth. Take 1 tablet every Friday      . metoprolol (LOPRESSOR) 100 MG tablet Take 0.5 tablets (50 mg total) by mouth 2 (two) times daily.  60 tablet  9  . Multiple Vitamins-Minerals ([employer]S SPECTRAVITE ADVANCED PO) Take 1 tablet by mouth daily.        Marland Kitchen  Multiple Vitamins-Minerals (OCUVITE ADULT 50+) CAPS Take 1 capsule by mouth 2 (two) times daily.        . niacin (NIASPAN) 500 MG CR tablet Take 500 mg by mouth at bedtime.        . Omega-3 Fatty Acids (FISH OIL) 1000 MG CAPS Take 1 capsule by mouth 2 (two) times daily.        . potassium chloride SA (K-DUR,KLOR-CON) 20 MEQ tablet Take 1 tablet (20 mEq total) by mouth 2 (two) times daily.  90 tablet  6  . ramipril (ALTACE) 2.5 MG capsule Take 1 capsule (2.5 mg total) by mouth 2 (two) times daily.  60 capsule  3  . spironolactone (ALDACTONE) 25 MG tablet Take 0.5 tablets (12.5 mg total) by mouth daily.  30 tablet  6  . warfarin (COUMADIN) 5 MG tablet Take as directed by anticoagulation clinic  45 tablet  3  . DISCONTD: metolazone (ZAROXOLYN) 2.5 MG tablet  Take 1 tablet (2.5 mg total) by mouth daily.  30 tablet  0     Allergies  Allergen Reactions  . Penicillins   . Sulfonamide Derivatives     History   Social History  . Marital Status: Married    Spouse Name: N/A    Number of Children: 10  . Years of Education: N/A   Occupational History  .     Social History Main Topics  . Smoking status: Former Smoker -- 1.0 packs/day    Types: Cigarettes    Quit date: 07/01/1999  . Smokeless tobacco: Never Used  . Alcohol Use: No     previously drank 2 drinks and a pack of cigarettes per day but has stopped since 2001  . Drug Use: Not on file  . Sexually Active: Not on file   Other Topics Concern  . Not on file   Social History Narrative  . No narrative on file    Family History  Problem Relation Age of Onset  . Stroke Father   . Cancer Mother   . Cardiomyopathy Brother     PHYSICAL EXAM: Filed Vitals:   06/09/11 1232  BP: 104/62  Pulse: 87  Wt 136  (146)  General:  Frail appearing. No respiratory difficulty HEENT: normal Neck: supple. JVP 5-6 Carotids 2+ bilat; no bruits. No lymphadenopathy or thryomegaly appreciated. Cor: PMI nondisplaced. Iregular rate & rhythm. S3 Lungs: decreased BS on Left /rhonchi  Abdomen: soft, nontender, nondistended. Liver edge palpable  No bruits or masses. Good bowel sounds. Extremities: no cyanosis, clubbing, rash, no edema, compression stockings. Neuro: alert & oriented x 3, cranial nerves grossly intact. moves all 4 extremities w/o difficulty. Affect pleasant.    ASSESSMENT & PLAN:

## 2011-06-25 ENCOUNTER — Telehealth: Payer: Self-pay | Admitting: Cardiology

## 2011-06-25 NOTE — Telephone Encounter (Signed)
Pt is now on hospice and they want to know if they can draw his inr and have our coumadin clinic adjust his dose as it is difficult for him to get to the clinic?

## 2011-06-25 NOTE — Telephone Encounter (Signed)
Spoke with Toniann Fail with Hospice and gave order to check INR on Friday at home. Appt has already been Cx.

## 2011-06-25 NOTE — Telephone Encounter (Signed)
New msg She wants to know if inr can be done at his home please call her back

## 2011-06-27 ENCOUNTER — Encounter: Payer: Medicare Other | Admitting: *Deleted

## 2011-06-27 ENCOUNTER — Ambulatory Visit (INDEPENDENT_AMBULATORY_CARE_PROVIDER_SITE_OTHER): Payer: Self-pay | Admitting: Cardiology

## 2011-06-27 DIAGNOSIS — I4891 Unspecified atrial fibrillation: Secondary | ICD-10-CM

## 2011-06-27 DIAGNOSIS — R0989 Other specified symptoms and signs involving the circulatory and respiratory systems: Secondary | ICD-10-CM

## 2011-07-04 ENCOUNTER — Telehealth: Payer: Self-pay | Admitting: Internal Medicine

## 2011-07-04 NOTE — Telephone Encounter (Signed)
New msg She was calling fluid gain. He has gone 135 to 139.5 before metolazone. HIs lungs are ok and some crackles. No sob

## 2011-07-04 NOTE — Telephone Encounter (Signed)
Spoke with Toniann Fail at [facility]. Weight increased 139 pounds but he is not symptomatic. No lower extremity edema. Instructed to continue current medications.

## 2011-07-07 ENCOUNTER — Encounter: Payer: Self-pay | Admitting: Internal Medicine

## 2011-07-07 ENCOUNTER — Encounter (HOSPITAL_COMMUNITY): Payer: Medicare Other

## 2011-07-08 ENCOUNTER — Ambulatory Visit (HOSPITAL_COMMUNITY)
Admission: RE | Admit: 2011-07-08 | Discharge: 2011-07-08 | Disposition: A | Discharge status: Home / Self Care | Payer: Medicare Other | Source: Ambulatory Visit | Attending: Internal Medicine | Admitting: Internal Medicine

## 2011-07-08 VITALS — BP 92/62 | HR 90 | Wt 138.5 lb

## 2011-07-08 DIAGNOSIS — I5022 Chronic systolic (congestive) heart failure: Secondary | ICD-10-CM | POA: Insufficient documentation

## 2011-07-08 NOTE — Progress Notes (Signed)
Patient ID: Ian Williams, male   DOB: January 05, 1931, 78 y.o.   MRN: [id number] Patient ID: Ian Williams, male   DOB: 1930/12/03, 78 y.o.   MRN: [id number]  HPI:  78 year old male ischemic cardiomyopathy, chronic systolic heart failure EF 15% followed by Dr. Antoine Poche and Dr. Darcus Austin.   PMHx notable - AF s/p DC-CV 7/12, CAD s/p CABG, gout and hypothyroid. S/p BiVICD medtronic MI - LV lead turned off due to dyspnea  Initially seen by Dr. Antoine Poche and weight was up. Treated with metolazone for several days with marked response. Referred for HF clinic for further management.  His lasix was decreased from 80/40 to 40/40 due to orthostasis.  Volumes status mildly elevated last visit and lasix doubled for 1 day.   Returns for follow up today. Feels comfortable. Complains of dizziness over the last few weeks.  Fell at home. He continues to take Metolazone once a week. He is followed by [facility].  Weight at home 136-139. No orthopnea/PND. SBP 90-100   Continues to wear compression stocking.  Appetite poor per wife. Uses oxygen at night. Non-productive cough.   ROS: All other systems normal except as mentioned in HPI, past medical history and problem list.    Past Medical History  Diagnosis Date  . Ischemic cardiomyopathy 02/28/00    CABG;8/11 Echo: EF=20-25% with septal akinesia and global hypokinesia and and LV systolic dysfunction.10/08 Echo: EF=35-40% with  LV dysfunction  . Paroxysmal atrial fibrillation     Referred at at time of ICD implantation-now on amiodarone  . Peripheral vascular disease     Iliac disease and mesenteric disease  . Carotid arterial disease     Left Carotid Endartectomy  . Cardiac arrest 02/24/2000  . Hypertension   . Long term current use of anticoagulant   . History of kidney stones   . Hyperlipidemia   . Dual implantable cardiac defibrillator in situ 04/2010    Revision 06/2010 -Medtronic  . Gout   . Diabetes mellitus type II   . DJD (degenerative joint  disease)   . BPH (benign prostatic hyperplasia)     Current Outpatient Prescriptions  Medication Sig Dispense Refill  . acetaminophen (TYLENOL) 325 MG tablet Take 650 mg by mouth 2 (two) times daily as needed.       Marland Kitchen allopurinol (ZYLOPRIM) 300 MG tablet Take 300 mg by mouth daily.        . digoxin (LANOXIN) 0.125 MG tablet Take 1/2 tablet by mouth once daily.      Marland Kitchen ezetimibe (ZETIA) 10 MG tablet Take 5 mg by mouth daily. 1/2 tablet      . famotidine (PEPCID) 20 MG tablet Take 20 mg by mouth daily.        . furosemide (LASIX) 40 MG tablet Take 40 mg by mouth 2 (two) times daily.        . hydroxypropyl methylcellulose (ISOPTO TEARS) 2.5 % ophthalmic solution Place 1 drop into both eyes 2 (two) times daily as needed. Opti fresh eye drops       . levothyroxine (SYNTHROID, LEVOTHROID) 100 MCG tablet Take 100 mcg by mouth daily. Take 1 tablet everyday except on Wednesdays and Fridays take 1.5 tablets      . metolazone (ZAROXOLYN) 2.5 MG tablet Take 2.5 mg by mouth. Take 1 tablet every Friday      . metoprolol (LOPRESSOR) 100 MG tablet Take 0.5 tablets (50 mg total) by mouth 2 (two) times daily.  60 tablet  9  . Multiple Vitamins-Minerals ([employer] SPECTRAVITE ADVANCED PO) Take 1 tablet by mouth daily.        . Multiple Vitamins-Minerals (OCUVITE ADULT 50+) CAPS Take 1 capsule by mouth 2 (two) times daily.        . niacin (NIASPAN) 500 MG CR tablet Take 500 mg by mouth at bedtime.        . Omega-3 Fatty Acids (FISH OIL) 1000 MG CAPS Take 1 capsule by mouth 2 (two) times daily.        . potassium chloride SA (K-DUR,KLOR-CON) 20 MEQ tablet Take 1 tablet (20 mEq total) by mouth 2 (two) times daily.  90 tablet  6  . ramipril (ALTACE) 2.5 MG capsule Take 1 capsule (2.5 mg total) by mouth 2 (two) times daily.  60 capsule  3  . spironolactone (ALDACTONE) 25 MG tablet Take 0.5 tablets (12.5 mg total) by mouth daily.  30 tablet  6  . warfarin (COUMADIN) 5 MG tablet Take as directed by anticoagulation clinic  45  tablet  3  . DISCONTD: metolazone (ZAROXOLYN) 2.5 MG tablet Take 1 tablet (2.5 mg total) by mouth daily.  30 tablet  0     Allergies  Allergen Reactions  . Penicillins   . Sulfonamide Derivatives     History   Social History  . Marital Status: Married    Spouse Name: N/A    Number of Children: 10  . Years of Education: N/A   Occupational History  .     Social History Main Topics  . Smoking status: Former Smoker -- 1.0 packs/day    Types: Cigarettes    Quit date: 07/01/1999  . Smokeless tobacco: Never Used  . Alcohol Use: No     previously drank 2 drinks and a pack of cigarettes per day but has stopped since 2001  . Drug Use: Not on file  . Sexually Active: Not on file   Other Topics Concern  . Not on file   Social History Narrative  . No narrative on file    Family History  Problem Relation Age of Onset  . Stroke Father   . Cancer Mother   . Cardiomyopathy Brother     PHYSICAL EXAM: Filed Vitals:   07/08/11 1407  BP: 92/62  Pulse: 90   Vitals - 1 value per visit 07/08/2011 06/09/2011 05/30/2011 05/06/2011  SYSTOLIC 92 104 82 86  DIASTOLIC 62 62 54 50  PULSE 90 87 88 88  RESPIRATIONS      Weight (lb) 138.5 136.25 146.75 144.8  HEIGHT      BMI 18.78 18.47 19.9 19.63   Vitals - 1 value per visit 04/23/2011 04/11/2011 03/24/2011  SYSTOLIC 92 94 100  DIASTOLIC 54 62 62  PULSE 90 82 68  RESPIRATIONS  16   Weight (lb) 146.25 142 138.75  HEIGHT  6\' 0"    BMI 19.83 19.25 22.41   General:  Frail appearing. No respiratory difficulty wife present HEENT: normal Neck: supple. JVP 10-11 Carotids 2+ bilat; no bruits. No lymphadenopathy or thryomegaly appreciated. Cor: PMI nondisplaced. Iregular rate & rhythm. S3 Lungs: decreased BS on Left /rhonchi  Abdomen: soft, nontender, nondistended. Liver edge palpable  No bruits or masses. Good bowel sounds. Extremities: no cyanosis, clubbing, rash, no edema, compression stockings. Neuro: alert & oriented x 3, cranial  nerves grossly intact. moves all 4 extremities w/o difficulty. Affect pleasant.    ASSESSMENT & PLAN:

## 2011-07-08 NOTE — Assessment & Plan Note (Addendum)
NYHA IIIIB. End stage heart failure with hospice following. Volume status mildly elevated. Discussed sliding scale diuretics if weight goes up 3 pounds. He will take an extra Lasix if weight increases 3 pounds. Follow up in 6 weeks.  Patient seen and examined with Ulyess Blossom PA-C. We discussed all aspects of the encounter. I agree with the assessment and plan as stated above. He is stable,.comfortable. Hospice providing support.

## 2011-07-08 NOTE — Patient Instructions (Signed)
Follow up with Dr Hanna Lions in 3 weeks  Follow up Heart failure Clinic in 6 weeks  Take one extra Lasix if your weight increases 3 pounds in 24 hours

## 2011-07-10 ENCOUNTER — Telehealth (HOSPITAL_COMMUNITY): Payer: Self-pay | Admitting: *Deleted

## 2011-07-10 MED ORDER — SPIRONOLACTONE 25 MG PO TABS: 25.0000 mg | ORAL_TABLET | Freq: Every day | ORAL | Status: DC

## 2011-07-10 NOTE — Telephone Encounter (Signed)
Ms Doolittle called today.  She needs a new prescription to be called in for Mr Gutman, the spironolactone.  It was increased by Dr Gala Romney, but the script was not changed.  Thanks!

## 2011-07-10 NOTE — Telephone Encounter (Signed)
rx sent into the pharmacy, pt's wife is aware

## 2011-07-18 ENCOUNTER — Other Ambulatory Visit: Payer: Self-pay | Admitting: *Deleted

## 2011-07-18 ENCOUNTER — Encounter: Payer: Self-pay | Admitting: Internal Medicine

## 2011-07-18 MED ORDER — NIACIN ER (ANTIHYPERLIPIDEMIC) 500 MG PO TBCR: 500.0000 mg | EXTENDED_RELEASE_TABLET | Freq: Every day | ORAL | Status: DC

## 2011-07-30 ENCOUNTER — Encounter: Payer: Self-pay | Admitting: Cardiology

## 2011-07-30 ENCOUNTER — Ambulatory Visit (INDEPENDENT_AMBULATORY_CARE_PROVIDER_SITE_OTHER): Payer: Medicare Other | Admitting: *Deleted

## 2011-07-30 ENCOUNTER — Ambulatory Visit (INDEPENDENT_AMBULATORY_CARE_PROVIDER_SITE_OTHER): Payer: Medicare Other | Admitting: Cardiology

## 2011-07-30 DIAGNOSIS — I4891 Unspecified atrial fibrillation: Secondary | ICD-10-CM

## 2011-07-30 DIAGNOSIS — B370 Candidal stomatitis: Secondary | ICD-10-CM | POA: Insufficient documentation

## 2011-07-30 DIAGNOSIS — Z95810 Presence of automatic (implantable) cardiac defibrillator: Secondary | ICD-10-CM

## 2011-07-30 DIAGNOSIS — I5022 Chronic systolic (congestive) heart failure: Secondary | ICD-10-CM

## 2011-07-30 LAB — BASIC METABOLIC PANEL
Calcium: 9.1 mg/dL (ref 8.4–10.5)
Creatinine, Ser: 1.5 mg/dL (ref 0.4–1.5)
GFR: 49.18 mL/min — ABNORMAL LOW (ref >60.00)
Glucose, Bld: 134 mg/dL — ABNORMAL HIGH (ref 70–99)
Sodium: 131 mEq/L — ABNORMAL LOW (ref 135–145)

## 2011-07-30 LAB — POCT INR: INR: 4

## 2011-07-30 MED ORDER — NYSTATIN 100000 UNIT/ML MT SUSP: 500000.0000 [IU] | Freq: Four times a day (QID) | OROMUCOSAL | Status: AC

## 2011-07-30 NOTE — Progress Notes (Signed)
HPI The patient was formerly seen by Dr. Deborah Chalk. After my first visit with him I sent him to CHF clinic.  Since I last saw him he has had hospice involved at home. He's been feeling well relatively. Please week. However, he's not been having any acute shortness of breath, PND or orthopnea. His weights have gone up recently and he is up about 4 pounds over the last 5 days. She's not describing any new chest pressure, neck or arm discomfort. He's not been having any palpitations, presyncope or syncope. He has no cough fevers or chills. He does have some chronic lower extremity swelling. He also has a slight rash on his tongue.  Allergies  Allergen Reactions  . Penicillins   . Sulfonamide Derivatives     Current Outpatient Prescriptions  Medication Sig Dispense Refill  . acetaminophen (TYLENOL) 325 MG tablet Take 650 mg by mouth 2 (two) times daily as needed.       Marland Kitchen allopurinol (ZYLOPRIM) 300 MG tablet Take 300 mg by mouth daily.        . digoxin (LANOXIN) 0.125 MG tablet Take 1/2 tablet by mouth once daily.      Marland Kitchen ezetimibe (ZETIA) 10 MG tablet Take 5 mg by mouth daily. 1/2 tablet      . famotidine (PEPCID) 20 MG tablet Take 20 mg by mouth daily.        . furosemide (LASIX) 40 MG tablet Take 40 mg by mouth 2 (two) times daily.        . hydroxypropyl methylcellulose (ISOPTO TEARS) 2.5 % ophthalmic solution Place 1 drop into both eyes 2 (two) times daily as needed. Opti fresh eye drops       . levothyroxine (SYNTHROID, LEVOTHROID) 100 MCG tablet Take 100 mcg by mouth daily. Take 1 tablet everyday except on Wednesdays and Fridays take 1.5 tablets      . metolazone (ZAROXOLYN) 2.5 MG tablet Take 2.5 mg by mouth. Take 1 tablet every Friday      . metoprolol (LOPRESSOR) 100 MG tablet Take 0.5 tablets (50 mg total) by mouth 2 (two) times daily.  60 tablet  9  . Multiple Vitamins-Minerals ([employer]S SPECTRAVITE ADVANCED PO) Take 1 tablet by mouth daily.        . Multiple Vitamins-Minerals (OCUVITE ADULT  50+) CAPS Take 1 capsule by mouth 2 (two) times daily.        . niacin (NIASPAN) 500 MG CR tablet Take 1 tablet (500 mg total) by mouth at bedtime.  90 tablet  6  . Omega-3 Fatty Acids (FISH OIL) 1000 MG CAPS Take 1 capsule by mouth 2 (two) times daily.        . potassium chloride SA (K-DUR,KLOR-CON) 20 MEQ tablet Take 1 tablet (20 mEq total) by mouth 2 (two) times daily.  90 tablet  6  . ramipril (ALTACE) 2.5 MG capsule Take 1 capsule (2.5 mg total) by mouth 2 (two) times daily.  60 capsule  3  . spironolactone (ALDACTONE) 25 MG tablet Take 1 tablet (25 mg total) by mouth daily.  30 tablet  6  . warfarin (COUMADIN) 5 MG tablet Take as directed by anticoagulation clinic  45 tablet  3  . DISCONTD: metolazone (ZAROXOLYN) 2.5 MG tablet Take 1 tablet (2.5 mg total) by mouth daily.  30 tablet  0  . DISCONTD: spironolactone (ALDACTONE) 25 MG tablet Take 0.5 tablets (12.5 mg total) by mouth daily.  30 tablet  6    Past Medical History  Diagnosis Date  . Ischemic cardiomyopathy 02/28/00    CABG;8/11 Echo: EF=20-25% with septal akinesia and global hypokinesia and and LV systolic dysfunction.10/08 Echo: EF=35-40% with  LV dysfunction  . Paroxysmal atrial fibrillation     Referred at at time of ICD implantation-now on amiodarone  . Peripheral vascular disease     Iliac disease and mesenteric disease  . Carotid arterial disease     Left Carotid Endartectomy  . Cardiac arrest 02/24/2000  . Hypertension   . Long term current use of anticoagulant   . History of kidney stones   . Hyperlipidemia   . Dual implantable cardiac defibrillator in situ 04/2010    Revision 06/2010 -Medtronic  . Gout   . Diabetes mellitus type II   . DJD (degenerative joint disease)   . BPH (benign prostatic hyperplasia)     Past Surgical History  Procedure Date  . Coronary artery bypass graft 2001    X4  . Cardiac defibrillator placement     ICD 04/2010  . Cardiac catheterization 02/2010    Severe LV dysjunction in a  global manner.  Totally occluded native cisrculation with some proximal conus collaterals to a small left anterior desending. Patent grafts.  . Transurethral resection of prostate 5/11  . Carotid endarectomy   . Fetal surgery for congenital hernia   . Left eye cataract surgery     ROS:  Hoarseness, otherwise stated in the HPI and negative for all other systems.  PHYSICAL EXAM BP 100/75  Pulse 109  Ht 6' (1.829 m)  Wt 144 lb (65.318 kg)  BMI 19.53 kg/m2 GENERAL:  Frail and chronically ill appearing HEENT:  Pupils equal round and reactive, fundi not visualized, oral mucosa with probable thrush on his tongue, dentures NECK:  JVD to the angel 90 degrees, carotid upstroke brisk and symmetric, no bruits, no thyromegaly LYMPHATICS:  No cervical, inguinal adenopathy LUNGS:  Clear to auscultation bilaterally BACK:  No CVA tenderness CHEST:  ICD pocket intack HEART:  PMI not displaced or sustained,S1 and S2 within normal limits, no S3, no S4, no clicks, no rubs, no murmurs, PMI displaced and sustained, 3/6 apical systolic murmur.   ABD:  Flat, positive bowel sounds normal in frequency in pitch, no bruits, no rebound, no guarding, no midline pulsatile mass, no hepatomegaly, no splenomegaly EXT:  2 plus pulses throughout, mild bilateral edema above the knees, no cyanosis no clubbing, diffuse muscle wasting SKIN:  No rashes no nodules NEURO:  Cranial nerves II through XII grossly intact, motor grossly intact throughout PSYCH:  Cognitively intact, oriented to person place and time  ASSESSMENT AND PLAN

## 2011-07-30 NOTE — Assessment & Plan Note (Signed)
I will give him nystatin swish and swallow.

## 2011-07-30 NOTE — Assessment & Plan Note (Signed)
I'm going to have him take an extra dose of Zaroxolyn today and then continue his previous regimen. I will check a basic metabolic profile today.

## 2011-07-30 NOTE — Assessment & Plan Note (Signed)
I will review with a heart failure clinic in the EP to see if there has been discussion about turning off his ICD. He does have backup right ventricular pacing.

## 2011-07-30 NOTE — Patient Instructions (Signed)
Please have blood work today (BMP)  Take an extra dose of Zaroxolyn today Continue all other medications as listed  May use Nystatin swish and swallow 4 times a day for 6 days for rash in mouth.  Follow up with Lawson Fiscal in 1 month.

## 2011-07-30 NOTE — Assessment & Plan Note (Signed)
He tolerates this rhythm. He will continue with meds as listed.

## 2011-08-07 ENCOUNTER — Encounter: Payer: Medicare Other | Admitting: *Deleted

## 2011-08-08 ENCOUNTER — Other Ambulatory Visit: Payer: Self-pay

## 2011-08-08 ENCOUNTER — Inpatient Hospital Stay (HOSPITAL_COMMUNITY)
Admission: EM | Admit: 2011-08-08 | Discharge: 2011-08-10 | DRG: 291 | Disposition: A | Discharge status: Home / Self Care | Source: Ambulatory Visit | Attending: Internal Medicine | Admitting: Internal Medicine

## 2011-08-08 ENCOUNTER — Encounter (HOSPITAL_COMMUNITY): Payer: Self-pay | Admitting: *Deleted

## 2011-08-08 ENCOUNTER — Emergency Department (HOSPITAL_COMMUNITY)

## 2011-08-08 DIAGNOSIS — R0989 Other specified symptoms and signs involving the circulatory and respiratory systems: Secondary | ICD-10-CM

## 2011-08-08 DIAGNOSIS — I739 Peripheral vascular disease, unspecified: Secondary | ICD-10-CM | POA: Diagnosis present

## 2011-08-08 DIAGNOSIS — E119 Type 2 diabetes mellitus without complications: Secondary | ICD-10-CM | POA: Diagnosis present

## 2011-08-08 DIAGNOSIS — I10 Essential (primary) hypertension: Secondary | ICD-10-CM

## 2011-08-08 DIAGNOSIS — E785 Hyperlipidemia, unspecified: Secondary | ICD-10-CM | POA: Diagnosis present

## 2011-08-08 DIAGNOSIS — Z79899 Other long term (current) drug therapy: Secondary | ICD-10-CM

## 2011-08-08 DIAGNOSIS — I472 Ventricular tachycardia, unspecified: Secondary | ICD-10-CM

## 2011-08-08 DIAGNOSIS — R0609 Other forms of dyspnea: Secondary | ICD-10-CM

## 2011-08-08 DIAGNOSIS — M1990 Unspecified osteoarthritis, unspecified site: Secondary | ICD-10-CM | POA: Diagnosis present

## 2011-08-08 DIAGNOSIS — I255 Ischemic cardiomyopathy: Secondary | ICD-10-CM

## 2011-08-08 DIAGNOSIS — E039 Hypothyroidism, unspecified: Secondary | ICD-10-CM

## 2011-08-08 DIAGNOSIS — I4891 Unspecified atrial fibrillation: Secondary | ICD-10-CM

## 2011-08-08 DIAGNOSIS — Z7901 Long term (current) use of anticoagulants: Secondary | ICD-10-CM

## 2011-08-08 DIAGNOSIS — Z95810 Presence of automatic (implantable) cardiac defibrillator: Secondary | ICD-10-CM

## 2011-08-08 DIAGNOSIS — I2510 Atherosclerotic heart disease of native coronary artery without angina pectoris: Secondary | ICD-10-CM | POA: Diagnosis present

## 2011-08-08 DIAGNOSIS — I369 Nonrheumatic tricuspid valve disorder, unspecified: Secondary | ICD-10-CM

## 2011-08-08 DIAGNOSIS — I2589 Other forms of chronic ischemic heart disease: Secondary | ICD-10-CM | POA: Diagnosis present

## 2011-08-08 DIAGNOSIS — B370 Candidal stomatitis: Secondary | ICD-10-CM

## 2011-08-08 DIAGNOSIS — R0600 Dyspnea, unspecified: Secondary | ICD-10-CM

## 2011-08-08 DIAGNOSIS — Z951 Presence of aortocoronary bypass graft: Secondary | ICD-10-CM

## 2011-08-08 DIAGNOSIS — N400 Enlarged prostate without lower urinary tract symptoms: Secondary | ICD-10-CM | POA: Diagnosis present

## 2011-08-08 DIAGNOSIS — J189 Pneumonia, unspecified organism: Secondary | ICD-10-CM | POA: Diagnosis present

## 2011-08-08 DIAGNOSIS — M109 Gout, unspecified: Secondary | ICD-10-CM | POA: Diagnosis present

## 2011-08-08 DIAGNOSIS — Z87891 Personal history of nicotine dependence: Secondary | ICD-10-CM

## 2011-08-08 DIAGNOSIS — I509 Heart failure, unspecified: Secondary | ICD-10-CM | POA: Diagnosis present

## 2011-08-08 DIAGNOSIS — I5022 Chronic systolic (congestive) heart failure: Principal | ICD-10-CM

## 2011-08-08 HISTORY — DX: Heart failure, unspecified: I50.9

## 2011-08-08 LAB — DIFFERENTIAL
Basophils Absolute: 0 10*3/uL (ref 0.0–0.1)
Eosinophils Relative: 0 % (ref 0–5)
Lymphocytes Relative: 8 % — ABNORMAL LOW (ref 12–46)
Monocytes Absolute: 0.8 10*3/uL (ref 0.1–1.0)

## 2011-08-08 LAB — CARDIAC PANEL(CRET KIN+CKTOT+MB+TROPI)
CK, MB: 2.5 ng/mL (ref 0.3–4.0)
CK, MB: 2 ng/mL (ref 0.3–4.0)
Total CK: 28 U/L (ref 7–232)
Troponin I: <0.3 ng/mL (ref <0.30)

## 2011-08-08 LAB — CBC
HCT: 38.5 % — ABNORMAL LOW (ref 39.0–52.0)
HCT: 42.9 % (ref 39.0–52.0)
MCV: 86.1 fL (ref 78.0–100.0)
MCV: 86.7 fL (ref 78.0–100.0)
RBC: 4.47 MIL/uL (ref 4.22–5.81)
RDW: 16.7 % — ABNORMAL HIGH (ref 11.5–15.5)
RDW: 16.9 % — ABNORMAL HIGH (ref 11.5–15.5)
WBC: 16.1 10*3/uL — ABNORMAL HIGH (ref 4.0–10.5)
WBC: 9.7 10*3/uL (ref 4.0–10.5)

## 2011-08-08 LAB — BASIC METABOLIC PANEL
CO2: 31 mEq/L (ref 19–32)
CO2: 33 mEq/L — ABNORMAL HIGH (ref 19–32)
Calcium: 9.3 mg/dL (ref 8.4–10.5)
Chloride: 93 mEq/L — ABNORMAL LOW (ref 96–112)
Creatinine, Ser: 0.82 mg/dL (ref 0.50–1.35)
Creatinine, Ser: 0.94 mg/dL (ref 0.50–1.35)
Glucose, Bld: 123 mg/dL — ABNORMAL HIGH (ref 70–99)

## 2011-08-08 LAB — PROTIME-INR: INR: 3.16 — ABNORMAL HIGH (ref 0.00–1.49)

## 2011-08-08 LAB — DIGOXIN LEVEL: Digoxin Level: 0.5 ng/mL — ABNORMAL LOW (ref 0.8–2.0)

## 2011-08-08 LAB — GLUCOSE, CAPILLARY: Glucose-Capillary: 88 mg/dL (ref 70–99)

## 2011-08-08 MED ORDER — ACETAMINOPHEN 325 MG PO TABS: 650.0000 mg | ORAL_TABLET | Freq: Two times a day (BID) | ORAL | Status: DC | PRN

## 2011-08-08 MED ORDER — EZETIMIBE 10 MG PO TABS
5.0000 mg | ORAL_TABLET | Freq: Every day | ORAL | Status: DC
  Administered 2011-08-08 – 2011-08-10 (×3): 5 mg via ORAL
  Filled 2011-08-08 (×3): qty 0.5

## 2011-08-08 MED ORDER — LEVOTHYROXINE SODIUM 100 MCG PO TABS
100.0000 ug | ORAL_TABLET | ORAL | Status: DC
  Administered 2011-08-09 – 2011-08-10 (×2): 100 ug via ORAL
  Filled 2011-08-08 (×3): qty 1

## 2011-08-08 MED ORDER — WARFARIN SODIUM 2.5 MG PO TABS
2.5000 mg | ORAL_TABLET | Freq: Once | ORAL | Status: AC
  Administered 2011-08-08: 2.5 mg via ORAL
  Filled 2011-08-08: qty 1

## 2011-08-08 MED ORDER — SODIUM CHLORIDE 0.9 % IV SOLN
250.0000 mL | INTRAVENOUS | Status: DC | PRN
  Administered 2011-08-08: 250 mL via INTRAVENOUS

## 2011-08-08 MED ORDER — FUROSEMIDE 10 MG/ML IJ SOLN
40.0000 mg | Freq: Two times a day (BID) | INTRAMUSCULAR | Status: DC
  Administered 2011-08-08: 40 mg via INTRAVENOUS
  Filled 2011-08-08 (×2): qty 4

## 2011-08-08 MED ORDER — LEVOTHYROXINE SODIUM 100 MCG PO TABS: 100.0000 ug | ORAL_TABLET | Freq: Every day | ORAL | Status: DC

## 2011-08-08 MED ORDER — HYPROMELLOSE (GONIOSCOPIC) 2.5 % OP SOLN: 1.0000 [drp] | Freq: Two times a day (BID) | OPHTHALMIC | Status: DC | PRN

## 2011-08-08 MED ORDER — METOPROLOL TARTRATE 50 MG PO TABS
50.0000 mg | ORAL_TABLET | Freq: Two times a day (BID) | ORAL | Status: DC
  Administered 2011-08-08 – 2011-08-10 (×5): 50 mg via ORAL
  Filled 2011-08-08 (×6): qty 1

## 2011-08-08 MED ORDER — RAMIPRIL 2.5 MG PO CAPS
2.5000 mg | ORAL_CAPSULE | Freq: Two times a day (BID) | ORAL | Status: DC
  Administered 2011-08-08 – 2011-08-10 (×5): 2.5 mg via ORAL
  Filled 2011-08-08 (×6): qty 1

## 2011-08-08 MED ORDER — METOLAZONE 2.5 MG PO TABS
2.5000 mg | ORAL_TABLET | ORAL | Status: DC
  Administered 2011-08-08: 2.5 mg via ORAL
  Filled 2011-08-08: qty 1

## 2011-08-08 MED ORDER — ALLOPURINOL 300 MG PO TABS
300.0000 mg | ORAL_TABLET | Freq: Every day | ORAL | Status: DC
  Administered 2011-08-08 – 2011-08-10 (×3): 300 mg via ORAL
  Filled 2011-08-08 (×3): qty 1

## 2011-08-08 MED ORDER — ONDANSETRON HCL 4 MG PO TABS: 4.0000 mg | ORAL_TABLET | Freq: Four times a day (QID) | ORAL | Status: DC | PRN

## 2011-08-08 MED ORDER — MORPHINE SULFATE 2 MG/ML IJ SOLN: 1.0000 mg | INTRAMUSCULAR | Status: DC | PRN

## 2011-08-08 MED ORDER — SODIUM CHLORIDE 0.9 % IJ SOLN
3.0000 mL | Freq: Two times a day (BID) | INTRAMUSCULAR | Status: DC
  Administered 2011-08-08 – 2011-08-09 (×5): 3 mL via INTRAVENOUS

## 2011-08-08 MED ORDER — SODIUM CHLORIDE 0.9 % IJ SOLN: 3.0000 mL | INTRAMUSCULAR | Status: DC | PRN

## 2011-08-08 MED ORDER — SODIUM CHLORIDE 0.9 % IV SOLN
INTRAVENOUS | Status: DC
  Administered 2011-08-08: 01:00:00 via INTRAVENOUS

## 2011-08-08 MED ORDER — DIGOXIN 0.0625 MG HALF TABLET
62.5000 ug | ORAL_TABLET | Freq: Every day | ORAL | Status: DC
  Administered 2011-08-08 – 2011-08-10 (×3): 62.5 ug via ORAL
  Filled 2011-08-08 (×3): qty 1

## 2011-08-08 MED ORDER — MOXIFLOXACIN HCL IN NACL 400 MG/250ML IV SOLN
400.0000 mg | Freq: Every day | INTRAVENOUS | Status: DC
  Administered 2011-08-08 – 2011-08-09 (×2): 400 mg via INTRAVENOUS
  Filled 2011-08-08 (×3): qty 250

## 2011-08-08 MED ORDER — ONDANSETRON HCL 4 MG/2ML IJ SOLN: 4.0000 mg | Freq: Four times a day (QID) | INTRAMUSCULAR | Status: DC | PRN

## 2011-08-08 MED ORDER — NITROGLYCERIN 2 % TD OINT: 1.0000 [in_us] | TOPICAL_OINTMENT | Freq: Three times a day (TID) | TRANSDERMAL | Status: DC

## 2011-08-08 MED ORDER — ISOSORBIDE MONONITRATE ER 30 MG PO TB24
30.0000 mg | ORAL_TABLET | Freq: Every day | ORAL | Status: DC
  Administered 2011-08-08 – 2011-08-10 (×3): 30 mg via ORAL
  Filled 2011-08-08 (×3): qty 1

## 2011-08-08 MED ORDER — NIACIN ER (ANTIHYPERLIPIDEMIC) 500 MG PO TBCR
500.0000 mg | EXTENDED_RELEASE_TABLET | Freq: Every day | ORAL | Status: DC
  Administered 2011-08-08 – 2011-08-09 (×2): 500 mg via ORAL
  Filled 2011-08-08 (×3): qty 1

## 2011-08-08 MED ORDER — SPIRONOLACTONE 25 MG PO TABS
25.0000 mg | ORAL_TABLET | Freq: Every day | ORAL | Status: DC
  Administered 2011-08-08 – 2011-08-10 (×3): 25 mg via ORAL
  Filled 2011-08-08 (×3): qty 1

## 2011-08-08 MED ORDER — SODIUM CHLORIDE 0.9 % IJ SOLN: 3.0000 mL | Freq: Two times a day (BID) | INTRAMUSCULAR | Status: DC

## 2011-08-08 MED ORDER — FAMOTIDINE 20 MG PO TABS
20.0000 mg | ORAL_TABLET | Freq: Every day | ORAL | Status: DC
  Administered 2011-08-08 – 2011-08-10 (×3): 20 mg via ORAL
  Filled 2011-08-08 (×4): qty 1

## 2011-08-08 MED ORDER — LEVOTHYROXINE SODIUM 150 MCG PO TABS
150.0000 ug | ORAL_TABLET | ORAL | Status: DC
  Administered 2011-08-08: 150 ug via ORAL
  Filled 2011-08-08: qty 1

## 2011-08-08 MED ORDER — FUROSEMIDE 10 MG/ML IJ SOLN
40.0000 mg | Freq: Every day | INTRAMUSCULAR | Status: DC
  Administered 2011-08-09: 40 mg via INTRAVENOUS
  Filled 2011-08-08: qty 4

## 2011-08-08 MED ORDER — OMEGA-3-ACID ETHYL ESTERS 1 G PO CAPS
1.0000 g | ORAL_CAPSULE | Freq: Two times a day (BID) | ORAL | Status: DC
  Administered 2011-08-08 – 2011-08-10 (×5): 1 g via ORAL
  Filled 2011-08-08 (×8): qty 1

## 2011-08-08 MED ORDER — POLYVINYL ALCOHOL 1.4 % OP SOLN
1.0000 [drp] | Freq: Two times a day (BID) | OPHTHALMIC | Status: DC | PRN
  Filled 2011-08-08: qty 15

## 2011-08-08 NOTE — H&P (Signed)
PCP:   Ezequiel Kayser, MD, MD   Chief Complaint: Shortness of breath   HPI: Ian Williams is an 78 y.o. male with severe coronary disease, ischemic cardiomyopathy status post coronary bypass graft in August 2011, congestive heart failure with ejection fraction of 20% with septal akinesis and global hypokinesis, paroxysmal atrial fibrillation on chronic anticoagulation, status post cardiac arrest, status post ICD implantation, peripheral vascular disease, carotid disease status post left endarterectomy, diabetes, presents to [facility] emergency room because of substernal chest pressure lasting over an hour. He also experienced shortness of breath for the past week. He has some cough which had thick and green sputum (the ER note stated clear sputum), but no fever or chills. He has noted orthopnea or PND, and he has been able to sleep all night without shortness of breath. He has had no calf tenderness or leg swelling. Evaluation in emergency room included a BNP of 12,000, but it should be noted that it was not a change 3 months ago. Prior to that, his BNP level was quite low. His chest x-ray shows question of congestion versus consolidation with no effusion. He has a normal white count of 9.7 thousand and hemoglobin of 14 g per decaliter. His EKG shows several paced complexes, and his cardiac markers were unremarkable. Hospitalist was asked to admit the patient for further evaluation of his shortness of breath.   Rewiew of Systems:  The patient denies anorexia, fever, weight loss,, vision loss, decreased hearing, hoarseness, chest pain, syncope, , peripheral edema, balance deficits, hemoptysis, abdominal pain, melena, hematochezia, severe indigestion/heartburn, hematuria, incontinence, genital sores, muscle weakness, suspicious skin lesions, transient blindness, difficulty walking, depression, unusual weight change, abnormal bleeding, enlarged lymph nodes, angioedema, and breast masses.    Past Medical  History  Diagnosis Date  . Ischemic cardiomyopathy 02/28/00    CABG;8/11 Echo: EF=20-25% with septal akinesia and global hypokinesia and and LV systolic dysfunction.10/08 Echo: EF=35-40% with  LV dysfunction  . Paroxysmal atrial fibrillation     Referred at at time of ICD implantation-now on amiodarone  . Peripheral vascular disease     Iliac disease and mesenteric disease  . Carotid arterial disease     Left Carotid Endartectomy  . Cardiac arrest 02/24/2000  . Hypertension   . Long term current use of anticoagulant   . History of kidney stones   . Hyperlipidemia   . Dual implantable cardiac defibrillator in situ 04/2010    Revision 06/2010 -Medtronic  . Gout   . Diabetes mellitus type II   . DJD (degenerative joint disease)   . BPH (benign prostatic hyperplasia)     Past Surgical History  Procedure Date  . Coronary artery bypass graft 2001    X4  . Cardiac defibrillator placement     ICD 04/2010  . 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  Totally occluded native cisrculation with some proximal conus collaterals to a small left anterior desending. Patent grafts.  . Transurethral resection of prostate 5/11  . Carotid endarectomy   . Fetal surgery for congenital hernia   . Left eye cataract surgery     Medications:  HOME MEDS: Prior to Admission medications   Medication Sig Start Date End Date Taking? Authorizing Provider  acetaminophen (TYLENOL) 325 MG tablet Take 650 mg by mouth 2 (two) times daily as needed. For pain   Yes Historical Provider, MD  allopurinol (ZYLOPRIM) 300 MG tablet Take 300 mg by mouth daily.  Yes Historical Provider, MD  digoxin (LANOXIN) 0.125 MG tablet Take 62.5 mcg by mouth daily. . 01/15/11  Yes Duke Salvia, MD  ezetimibe (ZETIA) 10 MG tablet Take 5 mg by mouth daily.    Yes Historical Provider, MD  famotidine (PEPCID) 20 MG tablet Take 20 mg by mouth daily.     Yes Historical Provider, MD  furosemide  (LASIX) 80 MG tablet Take 40 mg by mouth 2 (two) times daily.   Yes Historical Provider, MD  hydroxypropyl methylcellulose (ISOPTO TEARS) 2.5 % ophthalmic solution Place 1 drop into both eyes 2 (two) times daily as needed. Opti fresh eye drops    Yes Historical Provider, MD  levothyroxine (SYNTHROID, LEVOTHROID) 100 MCG tablet Take 100-150 mcg by mouth daily. Take 1 tablet everyday except on Wednesdays and Fridays take 1.5 tablets   Yes Historical Provider, MD  metolazone (ZAROXOLYN) 2.5 MG tablet Take 2.5 mg by mouth every 7 (seven) days. Take 1 tablet every Friday 02/25/11 02/25/12 Yes Rollene Rotunda, MD  metoprolol (LOPRESSOR) 100 MG tablet Take 0.5 tablets (50 mg total) by mouth 2 (two) times daily. 05/30/11  Yes Hadassah Pais, PA  Multiple Vitamins-Minerals (CVS SPECTRAVITE ADVANCED PO) Take 1 tablet by mouth daily.     Yes Historical Provider, MD  Multiple Vitamins-Minerals (OCUVITE ADULT 50+) CAPS Take 1 capsule by mouth 2 (two) times daily.     Yes Historical Provider, MD  niacin (NIASPAN) 500 MG CR tablet Take 1 tablet (500 mg total) by mouth at bedtime. 07/18/11  Yes Rosalio Macadamia, NP  nystatin (MYCOSTATIN) 100000 UNIT/ML suspension Take 5 mLs (500,000 Units total) by mouth 4 (four) times daily. 07/30/11 08/09/11 Yes Rollene Rotunda, MD  Omega-3 Fatty Acids (FISH OIL) 1000 MG CAPS Take 1 capsule by mouth 2 (two) times daily.     Yes Historical Provider, MD  ramipril (ALTACE) 2.5 MG capsule Take 1 capsule (2.5 mg total) by mouth 2 (two) times daily. 05/30/11  Yes Hadassah Pais, PA  spironolactone (ALDACTONE) 25 MG tablet Take 1 tablet (25 mg total) by mouth daily. 07/10/11 07/09/12 Yes Dolores Patty, MD  warfarin (COUMADIN) 5 MG tablet Take 2.5-5 mg by mouth daily. Take 1 tablet all days except Tuesday take 0.5 tablet   Yes Historical Provider, MD     Allergies:  Allergies  Allergen Reactions  . Penicillins Other (See Comments)    Unknown reaction  . Sulfonamide Derivatives Other  (See Comments)    Unknown reaction    Social History:   reports that he quit smoking about 12 years ago. His smoking use included Cigarettes. He smoked 1 pack per day. He has never used smokeless tobacco. He reports that he does not drink alcohol. His drug history not on file.  Family History: Family History  Problem Relation Age of Onset  . Stroke Father   . Cancer Mother   . Cardiomyopathy Brother      Physical Exam: Filed Vitals:   08/08/11 0046 08/08/11 0047 08/08/11 0048 08/08/11 0319  BP: 96/60 95/67 99/66  107/68  Pulse: 85 91 93 95  Temp:    97.7 F (36.5 C)  TempSrc:    Oral  Resp:    20  Height:    6' (1.829 m)  Weight:    59.7 kg (131 lb 9.8 oz)  SpO2:    98%   Blood pressure 107/68, pulse 95, temperature 97.7 F (36.5 C), temperature source Oral, resp. rate 20, height 6' (1.829 m), weight 59.7  kg (131 lb 9.8 oz), SpO2 98.00%.  GEN:  Pleasant  person lying in the stretcher in no acute distress; cooperative with exam PSYCH:  alert and oriented x4; does not appear anxious does not appear depressed; affect is normal HEENT: Mucous membranes pink and anicteric; PERRLA; EOM intact; no cervical lymphadenopathy nor thyromegaly or carotid bruit; no JVD; Breasts:: Not examined CHEST WALL: No tenderness CHEST: Normal respiration, with decreased breath sounds throughout and scattered rhonchi, no crackles HEART: Irregularly irregular  rate and rhythm; no murmurs rubs or gallops BACK: No kyphosis or scoliosis; no CVA tenderness ABDOMEN: Obese, soft non-tender; no masses, no organomegaly, normal abdominal bowel sounds; no pannus; no intertriginous candida. Rectal Exam: Not done EXTREMITIES: No bone or joint deformity; age-appropriate arthropathy of the hands and knees; no edema; no ulcerations. Genitalia: not examined PULSES: 2+ and symmetric SKIN: Normal hydration no rash or ulceration CNS: Cranial nerves 2-12 grossly intact no focal neurologic deficit   Labs &  Imaging Results for orders placed during the hospital encounter of 08/08/11 (from the past 48 hour(s))  CBC     Status: Abnormal   Collection Time   08/08/11 12:21 AM      Component Value Range Comment   WBC 9.7  4.0 - 10.5 (K/uL)    RBC 4.95  4.22 - 5.81 (MIL/uL)    Hemoglobin 14.2  13.0 - 17.0 (g/dL)    HCT 86.5  78.4 - 69.6 (%)    MCV 86.7  78.0 - 100.0 (fL)    MCH 28.7  26.0 - 34.0 (pg)    MCHC 33.1  30.0 - 36.0 (g/dL)    RDW 29.5 (*) 28.4 - 15.5 (%)    Platelets 159  150 - 400 (K/uL)   DIFFERENTIAL     Status: Abnormal   Collection Time   08/08/11 12:21 AM      Component Value Range Comment   Neutrophils Relative 82 (*) 43 - 77 (%)    Neutro Abs 8.0 (*) 1.7 - 7.7 (K/uL)    Lymphocytes Relative 8 (*) 12 - 46 (%)    Lymphs Abs 0.8  0.7 - 4.0 (K/uL)    Monocytes Relative 9  3 - 12 (%)    Monocytes Absolute 0.8  0.1 - 1.0 (K/uL)    Eosinophils Relative 0  0 - 5 (%)    Eosinophils Absolute 0.0  0.0 - 0.7 (K/uL)    Basophils Relative 0  0 - 1 (%)    Basophils Absolute 0.0  0.0 - 0.1 (K/uL)   BASIC METABOLIC PANEL     Status: Abnormal   Collection Time   08/08/11 12:21 AM      Component Value Range Comment   Sodium 133 (*) 135 - 145 (mEq/L)    Potassium 3.8  3.5 - 5.1 (mEq/L)    Chloride 91 (*) 96 - 112 (mEq/L)    CO2 31  19 - 32 (mEq/L)    Glucose, Bld 123 (*) 70 - 99 (mg/dL)    BUN 32 (*) 6 - 23 (mg/dL)    Creatinine, Ser 1.32  0.50 - 1.35 (mg/dL)    Calcium 9.3  8.4 - 10.5 (mg/dL)    GFR calc non Af Amer 78 (*) >90 (mL/min)    GFR calc Af Amer >90  >90 (mL/min)   POCT I-STAT TROPONIN I     Status: Normal   Collection Time   08/08/11 12:37 AM      Component Value Range Comment   Troponin  i, poc 0.01  0.00 - 0.08 (ng/mL)    Comment 3            PRO B NATRIURETIC PEPTIDE     Status: Abnormal   Collection Time   08/08/11  1:36 AM      Component Value Range Comment   Pro B Natriuretic peptide (BNP) 12780.0 (*) 0 - 450 (pg/mL)   GLUCOSE, CAPILLARY     Status: Normal    Collection Time   08/08/11  3:15 AM      Component Value Range Comment   Glucose-Capillary 88  70 - 99 (mg/dL)    Dg Chest Port 1 View  08/08/2011  *RADIOLOGY REPORT*  Clinical Data: Dyspnea on exertion  PORTABLE CHEST - 1 VIEW  Comparison: 06/09/2011  Findings: Previous CABG.  Left subclavian AICD stable.  Moderate cardiomegaly as before.  New perihilar and bibasilar interstitial and airspace edema or infiltrates with pulmonary vascular congestion.  No definite effusion.  IMPRESSION:  1.  Moderate cardiomegaly with worsening bilateral edema or infiltrates.  Original Report Authenticated By: Osa Craver, M.D.      Assessment Present on Admission:  .Biventricular ICD  -LV lead off secondary to increased dyspnea .Heart failure, systolic, chronic .Atrial fibrillation .Ischemic cardiomyopathy .HYPOTHYROIDISM .Hypertension   PLAN: First is possible that his shortness of breath is a combination of pneumonia and congestive heart failure. He certainly has ischemic cardiomyopathy with severe depressed LV function. We'll admit him for rule out and obtain cardiac echo. We'll switch him from by mouth Lasix to intravenous furosemide. I also like to add nitro paste to his regimen, but unfortunately, nitro paste is not available at this time. We will therefore start him on nitrate 30 mg of Imdur per day. For his possible community acquire pneumonia, we'll give him intravenous Avelox. I will continue his anticoagulation for his atrial fibrillation, continue his digitalis, and check a dig level. He is on Synthroid and this will be continued as well, which TSH. Although he is ill, he is stable, and will be admitted to [facility]ad hospitalist service. I discussed and confirmed tonight that he would like to be full code. I will honor his wish.   Other plans as per orders.    Ian Williams 08/08/2011, 4:12 AM

## 2011-08-08 NOTE — Progress Notes (Addendum)
ANTICOAGULATION CONSULT NOTE - Initial Consult  Pharmacy Consult for Coumadin Indication: atrial fibrillation  Allergies  Allergen Reactions  . Penicillins Other (See Comments)    Unknown reaction  . Sulfonamide Derivatives Other (See Comments)    Unknown reaction    Patient Measurements: Height: 6' (182.9 cm) Weight: 131 lb 9.8 oz (59.7 kg) IBW/kg (Calculated) : 77.6   Vital Signs: Temp: 97.7 F (36.5 C) (02/08 0319) Temp src: Oral (02/08 0319) BP: 107/68 mmHg (02/08 0319) Pulse Rate: 95  (02/08 0319)  Labs:  Basename 08/08/11 0021  HGB 14.2  HCT 42.9  PLT 159  APTT --  LABPROT --  INR --  HEPARINUNFRC --  CREATININE 0.94  CKTOTAL --  CKMB --  TROPONINI --   Estimated Creatinine Clearance: 54.7 ml/min (by C-G formula based on Cr of 0.94).  Medical History: Past Medical History  Diagnosis Date  . Ischemic cardiomyopathy 02/28/00    CABG;8/11 Echo: EF=20-25% with septal akinesia and global hypokinesia and and LV systolic dysfunction.10/08 Echo: EF=35-40% with  LV dysfunction  . Paroxysmal atrial fibrillation     Referred at at time of ICD implantation-now on amiodarone  . Peripheral vascular disease     Iliac disease and mesenteric disease  . Carotid arterial disease     Left Carotid Endartectomy  . Cardiac arrest 02/24/2000  . Hypertension   . Long term current use of anticoagulant   . History of kidney stones   . Hyperlipidemia   . Dual implantable cardiac defibrillator in situ 04/2010    Revision 06/2010 -Medtronic  . Gout   . Diabetes mellitus type II   . DJD (degenerative joint disease)   . BPH (benign prostatic hyperplasia)     Medications:  Prescriptions prior to admission  Medication Sig Dispense Refill  . acetaminophen (TYLENOL) 325 MG tablet Take 650 mg by mouth 2 (two) times daily as needed. For pain      . allopurinol (ZYLOPRIM) 300 MG tablet Take 300 mg by mouth daily.        . digoxin (LANOXIN) 0.125 MG tablet Take 62.5 mcg by mouth  daily. .      . ezetimibe (ZETIA) 10 MG tablet Take 5 mg by mouth daily.       . famotidine (PEPCID) 20 MG tablet Take 20 mg by mouth daily.        . furosemide (LASIX) 80 MG tablet Take 40 mg by mouth 2 (two) times daily.      . hydroxypropyl methylcellulose (ISOPTO TEARS) 2.5 % ophthalmic solution Place 1 drop into both eyes 2 (two) times daily as needed. Opti fresh eye drops       . levothyroxine (SYNTHROID, LEVOTHROID) 100 MCG tablet Take 100-150 mcg by mouth daily. Take 1 tablet everyday except on Wednesdays and Fridays take 1.5 tablets      . metolazone (ZAROXOLYN) 2.5 MG tablet Take 2.5 mg by mouth every 7 (seven) days. Take 1 tablet every Friday      . metoprolol (LOPRESSOR) 100 MG tablet Take 0.5 tablets (50 mg total) by mouth 2 (two) times daily.  60 tablet  9  . Multiple Vitamins-Minerals (CVS SPECTRAVITE ADVANCED PO) Take 1 tablet by mouth daily.        . Multiple Vitamins-Minerals (OCUVITE ADULT 50+) CAPS Take 1 capsule by mouth 2 (two) times daily.        . niacin (NIASPAN) 500 MG CR tablet Take 1 tablet (500 mg total) by mouth at bedtime.  90 tablet  6  . nystatin (MYCOSTATIN) 100000 UNIT/ML suspension Take 5 mLs (500,000 Units total) by mouth 4 (four) times daily.  120 mL  0  . Omega-3 Fatty Acids (FISH OIL) 1000 MG CAPS Take 1 capsule by mouth 2 (two) times daily.        . ramipril (ALTACE) 2.5 MG capsule Take 1 capsule (2.5 mg total) by mouth 2 (two) times daily.  60 capsule  3  . spironolactone (ALDACTONE) 25 MG tablet Take 1 tablet (25 mg total) by mouth daily.  30 tablet  6  . warfarin (COUMADIN) 5 MG tablet Take 2.5-5 mg by mouth daily. Take 1 tablet all days except Tuesday take 0.5 tablet        Assessment: 78 yo male admitted with PNA, h/o Afib to continue anticoagulation  Goal of Therapy:  INR 2-3   Plan:  F/U INR in am and continue Coumadin as indicated.  Eddie Candle 08/08/2011,3:47 AM  Addendum:  Assessment: 78 YOM on coumadin prior to admission  for afib. INR slightly supratherapeutic this morning (3.16), patient took a dose yesterday at home.   Home dose: 5mg  daily except Tuesdays take 2.5mg   Goal of Therapy:  INR 2-3   Plan:  1. Coumadin 2.5mg  po x 1 2. F/u INR in am   Riki Rusk 08/08/2011,10:05 AM

## 2011-08-08 NOTE — Plan of Care (Signed)
Problem: Phase I Progression Outcomes Goal: EF % per last Echo/documented,Core Reminder form on chart Outcome: Progressing 2D echo done today.     

## 2011-08-08 NOTE — ED Notes (Signed)
Pt reports SOB with exertion x 2 days.  BS clear bilaterally.  Pt been coughing.  Denies pain.  Paced on monitor.  Vitals stable.  20ga LFA.

## 2011-08-08 NOTE — ED Notes (Signed)
Attempt to call report x 1, RN unable.  

## 2011-08-08 NOTE — ED Provider Notes (Signed)
History     CSN: [id number]  Arrival date & time 08/08/11  0013   First MD Initiated Contact with Patient 08/08/11 0016      Chief Complaint  Patient presents with  . Shortness of Breath    (Consider location/radiation/quality/duration/timing/severity/associated sxs/prior treatment) Patient is a 78 y.o. male presenting with shortness of breath. The history is provided by the patient and the spouse.  Shortness of Breath  Associated symptoms include chest pain, cough and shortness of breath. Pertinent negatives include no fever.   patient is a 78 year old, male, with an extensive cardiac history including having a pacemaker, congestive heart failure, and coronary artery disease, with a history of cardiac bypass.  He presents to emergency department complaining of progressive shortness of breath.  He has had a cough, with white sputum, as well.  Earlier this evening.  He had pressure in his chest, which is currently resolved.  He denies nausea, vomiting, fevers, chills, or diaphoresis.  He denies pain or swelling.  He uses oxygen at nighttime to sleep, but not 24 hours a day.  Past Medical History  Diagnosis Date  . Ischemic cardiomyopathy 02/28/00    CABG;8/11 Echo: EF=20-25% with septal akinesia and global hypokinesia and and LV systolic dysfunction.10/08 Echo: EF=35-40% with  LV dysfunction  . Paroxysmal atrial fibrillation     Referred at at time of ICD implantation-now on amiodarone  . Peripheral vascular disease     Iliac disease and mesenteric disease  . Carotid arterial disease     Left Carotid Endartectomy  . Cardiac arrest 02/24/2000  . Hypertension   . Long term current use of anticoagulant   . History of kidney stones   . Hyperlipidemia   . Dual implantable cardiac defibrillator in situ 04/2010    Revision 06/2010 -[employer]tronic  . Gout   . Diabetes mellitus type II   . DJD (degenerative joint disease)   . BPH (benign prostatic hyperplasia)     Past Surgical History    Procedure Date  . Coronary artery bypass graft 2001    X4  . Cardiac defibrillator placement     ICD 04/2010  . 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  Totally occluded native cisrculation with some proximal conus collaterals to a small left anterior desending. Patent grafts.  . Transurethral resection of prostate 5/11  . Carotid endarectomy   . Fetal surgery for congenital hernia   . Left eye cataract surgery     Family History  Problem Relation Age of Onset  . Stroke Father   . Cancer Mother   . Cardiomyopathy Brother     History  Substance Use Topics  . Smoking status: Former Smoker -- 1.0 packs/day    Types: Cigarettes    Quit date: 07/01/1999  . Smokeless tobacco: Never Used  . Alcohol Use: No     previously drank 2 drinks and a pack of cigarettes per day but has stopped since 2001      Review of Systems  Constitutional: Negative for fever, chills and diaphoresis.  HENT: Positive for congestion.   Respiratory: Positive for cough and shortness of breath. Negative for chest tightness.   Cardiovascular: Positive for chest pain.       Chest pressure, previously, which has resolved now  Gastrointestinal: Negative for nausea, vomiting and abdominal pain.  Neurological: Negative for headaches.  Psychiatric/Behavioral: Negative for confusion.  All other systems reviewed and are negative.    Allergies  Penicillins and  Sulfonamide derivatives  Home Medications   Current Outpatient Rx  Name Route Sig Dispense Refill  . ACETAMINOPHEN 325 MG PO TABS Oral Take 650 mg by mouth 2 (two) times daily as needed. For pain    . ALLOPURINOL 300 MG PO TABS Oral Take 300 mg by mouth daily.      Marland Kitchen DIGOXIN 0.125 MG PO TABS Oral Take 62.5 mcg by mouth daily. .    . EZETIMIBE 10 MG PO TABS Oral Take 5 mg by mouth daily. 1/2 tablet    . FAMOTIDINE 20 MG PO TABS Oral Take 20 mg by mouth daily.      Marland Kitchen HYPROMELLOSE 2.5 % OP SOLN Both Eyes Place 1  drop into both eyes 2 (two) times daily as needed. Opti fresh eye drops     . LEVOTHYROXINE SODIUM 100 MCG PO TABS Oral Take 100 mcg by mouth daily. Take 1 tablet everyday except on Wednesdays and Fridays take 1.5 tablets    . METOLAZONE 2.5 MG PO TABS Oral Take 2.5 mg by mouth. Take 1 tablet every Friday    . METOPROLOL TARTRATE 100 MG PO TABS Oral Take 0.5 tablets (50 mg total) by mouth 2 (two) times daily. 60 tablet 9  . CVS SPECTRAVITE ADVANCED PO Oral Take 1 tablet by mouth daily.      [place]olina Primer ADULT 50+ PO CAPS Oral Take 1 capsule by mouth 2 (two) times daily.      Marland Kitchen NIACIN ER (ANTIHYPERLIPIDEMIC) 500 MG PO TBCR Oral Take 1 tablet (500 mg total) by mouth at bedtime. 90 tablet 6  . NYSTATIN 100000 UNIT/ML MT SUSP Oral Take 5 mLs (500,000 Units total) by mouth 4 (four) times daily. 120 mL 0  . FISH OIL 1000 MG PO CAPS Oral Take 1 capsule by mouth 2 (two) times daily.      Marland Kitchen POTASSIUM CHLORIDE CRYS ER 20 MEQ PO TBCR Oral Take 1 tablet (20 mEq total) by mouth 2 (two) times daily. 90 tablet 6  . RAMIPRIL 2.5 MG PO CAPS Oral Take 1 capsule (2.5 mg total) by mouth 2 (two) times daily. 60 capsule 3    Further refills by Dr. Paralee Cancel  . SPIRONOLACTONE 25 MG PO TABS Oral Take 1 tablet (25 mg total) by mouth daily. 30 tablet 6  . WARFARIN SODIUM 5 MG PO TABS  Take as directed by anticoagulation clinic 45 tablet 3    There were no vitals taken for this visit.  Physical Exam  Vitals reviewed. Constitutional: He is oriented to person, place, and time. No distress.       Cachectic  HENT:  Head: Normocephalic and atraumatic.  Eyes: Conjunctivae are normal. Pupils are equal, round, and reactive to light.  Neck: Normal range of motion. Neck supple.  Cardiovascular: Normal rate.        Ectopic beats  Pulmonary/Chest: Effort normal. No respiratory distress. He has rales.       Rales left anterior base  Abdominal: Soft. There is no tenderness.  Musculoskeletal: Normal range of motion. He  exhibits no edema.  Neurological: He is alert and oriented to person, place, and time.  Skin: Skin is warm and dry.  Psychiatric: He has a normal mood and affect. Thought content normal.    ED Course  Procedures (including critical care time) 78 year old male, with extensive cardiac disease, presents with a cough, with white sputum, and progressive shortness of breath.  He had chest pain earlier today, but it is resolved now.  We will establish an IV perform a chest x-ray, EKG, and laboratory testing, to further evaluate if his symptoms    Labs Reviewed  CBC  DIFFERENTIAL  BASIC METABOLIC PANEL   No results found.   No diagnosis found.  ED ECG REPORT   Date: 08/08/2011  EKG Time: 12:33 AM  Rate: 97  Rhythm: ventricular paced with intrinsic beats,    Axis: nl  Intervals:ventricular paced  ST&T Change: nonspecific  Narrative Interpretation: ventricular pacemaker     2:04 AM Spoke with triad.   He accepted for admission       MDM  Shortness of breath Infiltrates versus CHF on chest x-ray, however, no fevers, and normal white blood cell count, so it is not clear whether he has pneumonia.  Chest x-ray in my opinion.  Appears more consistent with pneumonia and CHF.  So we will add a BNP, and admit for further evaluation and treatment       Nicholes Stairs, MD 08/08/11 [phone]

## 2011-08-08 NOTE — Progress Notes (Signed)
  Echocardiogram 2D Echocardiogram has been performed.  Ian Williams 08/08/2011, 9:14 AM

## 2011-08-08 NOTE — Plan of Care (Signed)
Problem: Consults Goal: Heart Failure Patient Education (See Patient Education module for education specifics.)  Outcome: Progressing Heart Failure book given to pt.

## 2011-08-08 NOTE — ED Notes (Signed)
See triage note.  Pt denies pain, reports that the SOB started when he was watching TV and got worse when he attempted to walk to his bed. No distress noted at this time.  Denies pain.  No labored breathing noted.

## 2011-08-08 NOTE — Progress Notes (Signed)
Subjective:  No events overnight. Patient denies chest pain, shortness of breath, abdominal pain.   Objective:  Vital signs in last 24 hours:  Filed Vitals:   08/08/11 0700 08/08/11 0946 08/08/11 1143 08/08/11 1457  BP: 109/65 97/64 102/63 92/52  Pulse: 89 102 100 90  Temp: 98.7 F (37.1 C) 98.1 F (36.7 C)  97.3 F (36.3 C)  TempSrc: Oral Oral  Oral  Resp: 20 18 18 18   Height:      Weight:      SpO2: 98% 96% 94% 96%    Intake/Output from previous day:   Intake/Output Summary (Last 24 hours) at 08/08/11 1935 Last data filed at 08/08/11 1900  Gross per 24 hour  Intake   1513 ml  Output   1125 ml  Net    388 ml    Physical Exam: General: Alert, awake, oriented x3, in no acute distress. HEENT: No bruits, no goiter. Moist mucous membranes, no scleral icterus, no conjunctival pallor. Heart: Irregular rate and rhythm, no murmurs, rubs, gallops. Lungs: Clear to auscultation bilaterally with bibasilar crackles. No wheezing, no rhonchi, no rales.  Abdomen: Soft, nontender, nondistended, positive bowel sounds. Extremities: No clubbing or cyanosis, no pitting edema,  positive pedal pulses. Neuro: Grossly nonfocal.  Lab Results:  Basic Metabolic Panel:    Component Value Date/Time   NA 136 08/08/2011 0607   K 3.2* 08/08/2011 0607   CL 93* 08/08/2011 0607   CO2 33* 08/08/2011 0607   BUN 31* 08/08/2011 0607   CREATININE 0.82 08/08/2011 0607   GLUCOSE 83 08/08/2011 0607   CALCIUM 8.9 08/08/2011 0607   CBC:    Component Value Date/Time   WBC 16.1* 08/08/2011 0607   HGB 12.4* 08/08/2011 0607   HCT 38.5* 08/08/2011 0607   PLT 136* 08/08/2011 0607   MCV 86.1 08/08/2011 0607   NEUTROABS 8.0* 08/08/2011 0021   LYMPHSABS 0.8 08/08/2011 0021   MONOABS 0.8 08/08/2011 0021   EOSABS 0.0 08/08/2011 0021   BASOSABS 0.0 08/08/2011 0021      Lab 08/08/11 0607 08/08/11 0021  WBC 16.1* 9.7  HGB 12.4* 14.2  HCT 38.5* 42.9  PLT 136* 159  MCV 86.1 86.7  MCH 27.7 28.7  MCHC 32.2 33.1  RDW 16.7* 16.9*    LYMPHSABS -- 0.8  MONOABS -- 0.8  EOSABS -- 0.0  BASOSABS -- 0.0  BANDABS -- --    Lab 08/08/11 0607 08/08/11 0021  NA 136 133*  K 3.2* 3.8  CL 93* 91*  CO2 33* 31  GLUCOSE 83 123*  BUN 31* 32*  CREATININE 0.82 0.94  CALCIUM 8.9 9.3  MG -- --    Lab 08/08/11 0607  INR 3.16*  PROTIME --   Cardiac markers:  Lab 08/08/11 1112 08/08/11 0347  CKMB 2.0 2.5  TROPONINI <0.30 <0.30  MYOGLOBIN -- --   No components found with this basename: POCBNP:3 No results found for this or any previous visit (from the past 240 hour(s)).  Studies/Results: Dg Chest Port 1 View 08/08/2011   IMPRESSION:  1.  Moderate cardiomegaly with worsening bilateral edema or infiltrates.     Medications: Scheduled Meds:   . allopurinol  300 mg Oral Daily  . digoxin  62.5 mcg Oral Daily  . ezetimibe  5 mg Oral Daily  . famotidine  20 mg Oral Daily  . furosemide  40 mg Intravenous Q12H  . isosorbide mononitrate  30 mg Oral Daily  . levothyroxine  100 mcg Oral Custom  . levothyroxine  150 mcg Oral Custom  . metolazone  2.5 mg Oral Q7 days  . metoprolol  50 mg Oral BID  . moxifloxacin  400 mg Intravenous QHS  . niacin  500 mg Oral QHS  . omega-3 acid ethyl esters  1 g Oral BID  . ramipril  2.5 mg Oral BID  . sodium chloride  3 mL Intravenous Q12H  . spironolactone  25 mg Oral Daily  . warfarin  2.5 mg Oral ONCE-1800  . DISCONTD: levothyroxine  100-150 mcg Oral Daily  . DISCONTD: nitroGLYCERIN  1 inch Topical Q8H  . DISCONTD: sodium chloride  3 mL Intravenous Q12H   Continuous Infusions:   . DISCONTD: sodium chloride 125 mL/hr at 08/08/11 0105   PRN Meds:.sodium chloride, acetaminophen, morphine, ondansetron (ZOFRAN) IV, ondansetron, polyvinyl alcohol, sodium chloride, DISCONTD: hydroxypropyl methylcellulose  Assessment/Plan:  Principal Problem:  *Heart failure, systolic, chronic - now clinically improved - will continue lasix IV and will transition to PO - continue to monitor strict  I's and O's and daily weights  Active Problems:  Atrial fibrillation - rate controlled - continue coumadin and digoxin   Dyspnea - most likely multifactorial PNA and volume overload - continue abx and Lasix - obtain BMP in AM   Hypertension - stable - continue home medication regimen   HYPOTHYROIDISM - TSH is WNL - continue synthroid   Biventricular ICD  -LV lead off secondary to increased dyspnea   Ischemic cardiomyopathy    EDUCATION - test results and diagnostic studies were discussed with patient  - patient verbalized the understanding - questions were answered at the bedside and contact information was provided for additional questions or concerns   LOS: 0 days   Debbora Presto 08/08/2011, 7:35 PM  [facility] Pager: [phone]  Patient ID: Ian Williams, male   DOB: 11-23-1930, 78 y.o.   MRN: [id number]

## 2011-08-08 NOTE — Progress Notes (Signed)
Pt. Arrived to floor.  Patient settled in bed and put on heart monitor.  VSS (see doc flowsheet).  Pt. Instructed how and when to use call bell.  Pt. Belongings in bag in closet.  Pt. Has family at bedside.  Will continue to monitor patient.

## 2011-08-08 NOTE — ED Notes (Signed)
EKG DONE BY EMT R Sary Bogie- OLD AND NEW EKG GIVEN TO DR Nino Parsley

## 2011-08-08 NOTE — Progress Notes (Signed)
Room [address]   [address] [facility]  [facility] RN Visit  Related admission to [facility] diagnosis of CHF.  Pt is FULL  code.    Pt alert & oriented,sitting up in bed, with no complaints of pain or discomfort.    No family present.  Pt recalled details of symptoms prior to calling for ambulance transport.  Please call [facility] @ [phone] with any needs.  Thank you.  Joneen Boers, RN  [facility]  Hospice Liaison

## 2011-08-09 LAB — BASIC METABOLIC PANEL
BUN: 34 mg/dL — ABNORMAL HIGH (ref 6–23)
CO2: 33 mEq/L — ABNORMAL HIGH (ref 19–32)
CO2: 34 mEq/L — ABNORMAL HIGH (ref 19–32)
Chloride: 89 mEq/L — ABNORMAL LOW (ref 96–112)
Chloride: 86 mEq/L — ABNORMAL LOW (ref 96–112)
Creatinine, Ser: 0.98 mg/dL (ref 0.50–1.35)
GFR calc Af Amer: 81 mL/min — ABNORMAL LOW (ref >90)
Potassium: 3.6 mEq/L (ref 3.5–5.1)

## 2011-08-09 LAB — CBC
HCT: 36.8 % — ABNORMAL LOW (ref 39.0–52.0)
Hemoglobin: 11.8 g/dL — ABNORMAL LOW (ref 13.0–17.0)
RBC: 4.27 MIL/uL (ref 4.22–5.81)
RDW: 16.9 % — ABNORMAL HIGH (ref 11.5–15.5)
WBC: 11 10*3/uL — ABNORMAL HIGH (ref 4.0–10.5)

## 2011-08-09 LAB — PROTIME-INR: Prothrombin Time: 36.8 seconds — ABNORMAL HIGH (ref 11.6–15.2)

## 2011-08-09 LAB — PRO B NATRIURETIC PEPTIDE: Pro B Natriuretic peptide (BNP): 11814 pg/mL — ABNORMAL HIGH (ref 0–450)

## 2011-08-09 MED ORDER — FUROSEMIDE 40 MG PO TABS
40.0000 mg | ORAL_TABLET | Freq: Two times a day (BID) | ORAL | Status: DC
  Administered 2011-08-09 – 2011-08-10 (×2): 40 mg via ORAL
  Filled 2011-08-09 (×4): qty 1

## 2011-08-09 NOTE — Progress Notes (Signed)
Patient ID: Ian Williams, male   DOB: January 22, 1931, 78 y.o.   MRN: [id number]  Subjective: No events overnight. Patient denies chest pain, shortness of breath, abdominal pain. Had bowel movement and reports ambulating.  Objective:  Vital signs in last 24 hours:  Filed Vitals:   08/09/11 0658 08/09/11 0910 08/09/11 0911 08/09/11 1036  BP: 86/61 96/59  100/61  Pulse: 89 96 101 87  Temp: 97.9 F (36.6 C) 97.9 F (36.6 C)    TempSrc:  Oral    Resp: 12 16  16   Height:      Weight: 61.2 kg (134 lb 14.7 oz)     SpO2: 100% 97%  99%    Intake/Output from previous day:   Intake/Output Summary (Last 24 hours) at 08/09/11 1304 Last data filed at 08/09/11 1200  Gross per 24 hour  Intake   1763 ml  Output   1826 ml  Net    -63 ml    Physical Exam: General: Alert, awake, oriented x3, in no acute distress. HEENT: No bruits, no goiter. Moist mucous membranes, no scleral icterus, no conjunctival pallor. Heart: Irregular rate and rhythm, no murmurs, rubs, gallops. Lungs: Clear to auscultation bilaterally with minimal bibasilar crackles. No wheezing, no rhonchi, no rales.  Abdomen: Soft, nontender, nondistended, positive bowel sounds. Extremities: No clubbing or cyanosis, no pitting edema,  positive pedal pulses. Neuro: Grossly nonfocal.  Lab Results:  Basic Metabolic Panel:    Component Value Date/Time   NA 129* 08/09/2011 0930   K 3.7 08/09/2011 0930   CL 86* 08/09/2011 0930   CO2 34* 08/09/2011 0930   BUN 35* 08/09/2011 0930   CREATININE 0.98 08/09/2011 0930   GLUCOSE 127* 08/09/2011 0930   CALCIUM 9.5 08/09/2011 0930   CBC:    Component Value Date/Time   WBC 11.0* 08/09/2011 0530   HGB 11.8* 08/09/2011 0530   HCT 36.8* 08/09/2011 0530   PLT 135* 08/09/2011 0530   MCV 86.2 08/09/2011 0530   NEUTROABS 8.0* 08/08/2011 0021   LYMPHSABS 0.8 08/08/2011 0021   MONOABS 0.8 08/08/2011 0021   EOSABS 0.0 08/08/2011 0021   BASOSABS 0.0 08/08/2011 0021      Lab 08/09/11 0530 08/08/11 0607 08/08/11 0021    WBC 11.0* 16.1* 9.7  HGB 11.8* 12.4* 14.2  HCT 36.8* 38.5* 42.9  PLT 135* 136* 159  MCV 86.2 86.1 86.7  MCH 27.6 27.7 28.7  MCHC 32.1 32.2 33.1  RDW 16.9* 16.7* 16.9*  LYMPHSABS -- -- 0.8  MONOABS -- -- 0.8  EOSABS -- -- 0.0  BASOSABS -- -- 0.0  BANDABS -- -- --    Lab 08/09/11 0930 08/09/11 0530 08/08/11 0607 08/08/11 0021  NA 129* 130* 136 133*  K 3.7 3.6 3.2* 3.8  CL 86* 89* 93* 91*  CO2 34* 33* 33* 31  GLUCOSE 127* 86 83 123*  BUN 35* 34* 31* 32*  CREATININE 0.98 1.00 0.82 0.94  CALCIUM 9.5 8.9 8.9 9.3  MG -- -- -- --    Lab 08/09/11 0530 08/08/11 0607  INR 3.64* 3.16*  PROTIME -- --   Cardiac markers:  Lab 08/08/11 1953 08/08/11 1112 08/08/11 0347  CKMB 2.1 2.0 2.5  TROPONINI <0.30 <0.30 <0.30  MYOGLOBIN -- -- --   Studies/Results:  Dg Chest Port 1 View 08/08/2011   IMPRESSION:  1.  Moderate cardiomegaly with worsening bilateral edema or infiltrates.    Medications: Scheduled Meds:   . allopurinol  300 mg Oral Daily  . digoxin  62.5 mcg Oral Daily  . ezetimibe  5 mg Oral Daily  . famotidine  20 mg Oral Daily  . furosemide  40 mg Oral BID  . isosorbide mononitrate  30 mg Oral Daily  . levothyroxine  100 mcg Oral Custom  . levothyroxine  150 mcg Oral Custom  . metolazone  2.5 mg Oral Q7 days  . metoprolol  50 mg Oral BID  . moxifloxacin  400 mg Intravenous QHS  . niacin  500 mg Oral QHS  . omega-3 acid ethyl esters  1 g Oral BID  . ramipril  2.5 mg Oral BID  . sodium chloride  3 mL Intravenous Q12H  . spironolactone  25 mg Oral Daily  . warfarin  2.5 mg Oral ONCE-1800  . DISCONTD: furosemide  40 mg Intravenous Q12H  . DISCONTD: furosemide  40 mg Intravenous Daily   Continuous Infusions:  PRN Meds:.sodium chloride, acetaminophen, morphine, ondansetron (ZOFRAN) IV, ondansetron, polyvinyl alcohol, sodium chloride  Assessment/Plan:  Principal Problem:  *Heart failure, systolic, chronic  - now clinically improved but 2 D ECHO shows 20 % EF - will  continue lasix PO as per home regimen as pt appears to be adequately diuresed on high dose of lasix - continue to monitor strict I's and O's and daily weights   Active Problems:  Atrial fibrillation  - rate controlled  - continue coumadin and digoxin   Dyspnea  - most likely multifactorial, PNA and volume overload  - continue abx (Avelox day 2/7) and Lasix  - obtain BMP in AM   Hypertension  - stable  - continue home medication regimen   HYPOTHYROIDISM  - TSH is WNL  - continue synthroid   Biventricular ICD -LV lead off secondary to increased dyspnea   Ischemic cardiomyopathy   EDUCATION  - test results and diagnostic studies were discussed with patient  - patient verbalized the understanding  - questions were answered at the bedside and contact information was provided for additional questions or concerns   LOS: 1 day   MAGICK-Kearstyn Avitia 08/09/2011, 1:04 PM  [facility] Pager: [phone]

## 2011-08-09 NOTE — Progress Notes (Signed)
1800 placed a call to DR.Izola Price . Pt runs of V-tach 11 beats and broke spontaneously to v paced   Pt asymptomatic resting comfortably with family in attendance. Will follow

## 2011-08-09 NOTE — Progress Notes (Signed)
Related admission, CHF exacerbation.  Patient is a full code.  Found patient eating his lunch, no new issues.  Reviewed chart and patient continues to receive Coumadin and Digoxin for chronic Afib.  Patient is transitioning from IV to PO Lasix for treatment of fluid overload, with monitoring of I&O.  Continue current plan of care and symptom management.  Encourage a call to Hospice at [phone] with any needs.  Charlott Holler, RN, BSN

## 2011-08-09 NOTE — Evaluation (Signed)
Physical Therapy Evaluation Patient Details Name: Ian Williams MRN: [id number] DOB: 11-May-1931 Today's Date: 08/09/2011  Problem List:  Patient Active Problem List  Diagnoses  . Atrial fibrillation  . Heart failure, systolic, chronic  . SNORING  . HYPOTHYROIDISM  . VENTRICULAR TACHYCARDIA  . Biventricular ICD  -LV lead off secondary to increased dyspnea  . Hypertension  . Ischemic cardiomyopathy  . Hypotension  . Thrush  . Dyspnea    Past Medical History:  Past Medical History  Diagnosis Date  . Ischemic cardiomyopathy 02/28/00    CABG;8/11 Echo: EF=20-25% with septal akinesia and global hypokinesia and and LV systolic dysfunction.10/08 Echo: EF=35-40% with  LV dysfunction  . Paroxysmal atrial fibrillation     Referred at at time of ICD implantation-now on amiodarone  . Peripheral vascular disease     Iliac disease and mesenteric disease  . Carotid arterial disease     Left Carotid Endartectomy  . Cardiac arrest 02/24/2000  . Hypertension   . Long term current use of anticoagulant   . History of kidney stones   . Hyperlipidemia   . Dual implantable cardiac defibrillator in situ 04/2010    Revision 06/2010 -[employer]  . Gout   . Diabetes mellitus type II   . DJD (degenerative joint disease)   . BPH (benign prostatic hyperplasia)   . CHF (congestive heart failure)    Past Surgical History:  Past Surgical History  Procedure Date  . Coronary artery bypass graft 2001    X4  . Cardiac defibrillator placement     ICD 04/2010  . 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  Totally occluded native cisrculation with some proximal conus collaterals to a small left anterior desending. Patent grafts.  . Transurethral resection of prostate 5/11  . Carotid endarectomy   . Fetal surgery for congenital hernia   . Left eye cataract surgery     PT Assessment/Plan/Recommendation PT Assessment Clinical Impression Statement: Pt is 78 y/o male  admitted for SOB with CHF exacerbation.  Pt will benefit from acute PT services to improve overall mobility and maximize independence to prepare for safe d/c home with family. PT Recommendation/Assessment: Patient will need skilled PT in the acute care venue PT Problem List: Decreased strength;Decreased activity tolerance;Decreased balance;Decreased mobility;Decreased knowledge of use of DME PT Therapy Diagnosis : Difficulty walking;Abnormality of gait;Generalized weakness PT Plan PT Frequency: Min 3X/week PT Treatment/Interventions: DME instruction;Gait training;Stair training;Functional mobility training;Therapeutic activities;Therapeutic exercise;Balance training;Patient/family education PT Recommendation Follow Up Recommendations: Home health PT;Supervision/Assistance - 24 hour Equipment Recommended: None recommended by PT PT Goals  Acute Rehab PT Goals PT Goal Formulation: With patient Time For Goal Achievement: 7 days Pt will go Supine/Side to Sit: with modified independence PT Goal: Supine/Side to Sit - Progress: Goal set today Pt will go Sit to Supine/Side: with modified independence PT Goal: Sit to Supine/Side - Progress: Goal set today Pt will go Sit to Stand: with modified independence PT Goal: Sit to Stand - Progress: Goal set today Pt will go Stand to Sit: with modified independence PT Goal: Stand to Sit - Progress: Goal set today Pt will Ambulate: >150 feet;with modified independence;with least restrictive assistive device PT Goal: Ambulate - Progress: Goal set today Pt will Go Up / Down Stairs: 1-2 stairs;with modified independence;with least restrictive assistive device PT Goal: Up/Down Stairs - Progress: Goal set today  PT Evaluation Precautions/Restrictions  Precautions Precautions: Fall Restrictions Weight Bearing Restrictions: No Prior Functioning  Home Living Lives  With: Spouse;Son [place] Help From: Family Type of Home: House Home Layout: Two level;Able to  live on main level with bedroom/bathroom Alternate Level Stairs-Rails: None Home Access: Stairs to enter Entrance Stairs-Rails: Doctor, general practice of Steps: 2 Bathroom Shower/Tub: Engineer, manufacturing systems: Standard Bathroom Accessibility: Yes How Accessible: Accessible via walker Home Adaptive Equipment: Grab bars in shower;Straight cane Prior Function Level of Independence: Independent with basic ADLs;Independent with gait;Independent with transfers Able to Take Stairs?: Yes Vocation: Retired Financial risk analyst Arousal/Alertness: Awake/alert Overall Cognitive Status: Appears within functional limits for tasks assessed Sensation/Coordination   Extremity Assessment RUE Assessment RUE Assessment: Within Functional Limits LUE Assessment LUE Assessment: Within Functional Limits RLE Assessment RLE Assessment: Within Functional Limits LLE Assessment LLE Assessment: Within Functional Limits Mobility (including Balance) Bed Mobility Bed Mobility: Yes Supine to Sit: 5: Supervision Supine to Sit Details (indicate cue type and reason): Supervision for safety Transfers Transfers: Yes Sit to Stand: 5: Supervision;From bed Sit to Stand Details (indicate cue type and reason): Supervision for safety Stand to Sit: 5: Supervision;To bed Stand to Sit Details: Supervision for safety Ambulation/Gait Ambulation/Gait: Yes Ambulation/Gait Assistance: 4: Min assist Ambulation/Gait Assistance Details (indicate cue type and reason): (A) to maintain balance. Pt attends to sway laterally with occasional staggered gait. Ambulation Distance (Feet): 150 Feet Assistive device: 1 person hand held assist Gait Pattern: Shuffle;Trunk flexed;Decreased stance time - right;Decreased stance time - left;Decreased stride length  Posture/Postural Control Posture/Postural Control: Postural limitations Postural Limitations: mild kyphotic posture Balance Balance Assessed: Yes Static  Sitting Balance Static Sitting - Balance Support: Feet supported Static Sitting - Level of Assistance: 5: Stand by assistance Exercise    End of Session PT - End of Session Equipment Utilized During Treatment: Gait belt Activity Tolerance: Patient tolerated treatment well Patient left: in bed;with call bell in reach (Pt sitting EOB) Nurse Communication: Mobility status for transfers;Mobility status for ambulation General Behavior During Session: [facility] for tasks performed Cognition: [facility] for tasks performed  Shrihaan Porzio 08/09/2011, 1:35 PM [phone]

## 2011-08-09 NOTE — Progress Notes (Signed)
ANTICOAGULATION CONSULT NOTE - Follow Up Consult  Pharmacy Consult for Coumadin  Indication: atrial fibrillation  Assessment: Ian Williams is a 78 year old male on chronic coumadin therapy for afib. INR today is 3.64. Noted patient started on avelox which can increase INR. Patient's INR goal is 2-3.   Home dose of coumadin is 5mg  daily except 2.5mg  on Tuesday.   CBC is ok and no bleeding has been documented in the notes.   Goal of Therapy:  INR 2-3   Plan:  1. No coumadin today because avelox can also increase INR 2. Will check INR tomorrow and dose accordingly, patient will likely need lower doses of warfarin while on antibiotics.   Thank you,  Brett Fairy, PharmD Pager: [phone]  08/09/2011 10:20 AM   Allergies  Allergen Reactions  . Penicillins Other (See Comments)    Unknown reaction  . Sulfonamide Derivatives Other (See Comments)    Unknown reaction    Patient Measurements: Height: 6' (182.9 cm) Weight: 134 lb 14.7 oz (61.2 kg) (bed) IBW/kg (Calculated) : 77.6   Vital Signs: Temp: 97.9 F (36.6 C) (02/09 0910) Temp src: Oral (02/09 0910) BP: 96/59 mmHg (02/09 0910) Pulse Rate: 101  (02/09 0911)  Labs:  Basename 08/09/11 0530 08/08/11 1953 08/08/11 1112 08/08/11 0607 08/08/11 0347 08/08/11 0021  HGB 11.8* -- -- 12.4* -- --  HCT 36.8* -- -- 38.5* -- 42.9  PLT 135* -- -- 136* -- 159  APTT -- -- -- -- -- --  LABPROT 36.8* -- -- 32.9* -- --  INR 3.64* -- -- 3.16* -- --  HEPARINUNFRC -- -- -- -- -- --  CREATININE 1.00 -- -- 0.82 -- 0.94  CKTOTAL -- 36 28 -- 41 --  CKMB -- 2.1 2.0 -- 2.5 --  TROPONINI -- <0.30 <0.30 -- <0.30 --   Estimated Creatinine Clearance: 52.7 ml/min (by C-G formula based on Cr of 1).  Medications:  Scheduled:    . allopurinol  300 mg Oral Daily  . digoxin  62.5 mcg Oral Daily  . ezetimibe  5 mg Oral Daily  . famotidine  20 mg Oral Daily  . furosemide  40 mg Intravenous Daily  . isosorbide mononitrate  30 mg Oral Daily  .  levothyroxine  100 mcg Oral Custom  . levothyroxine  150 mcg Oral Custom  . metolazone  2.5 mg Oral Q7 days  . metoprolol  50 mg Oral BID  . moxifloxacin  400 mg Intravenous QHS  . niacin  500 mg Oral QHS  . omega-3 acid ethyl esters  1 g Oral BID  . ramipril  2.5 mg Oral BID  . sodium chloride  3 mL Intravenous Q12H  . spironolactone  25 mg Oral Daily  . warfarin  2.5 mg Oral ONCE-1800  . DISCONTD: furosemide  40 mg Intravenous Q12H   Infusions:   PRN: sodium chloride, acetaminophen, morphine, ondansetron (ZOFRAN) IV, ondansetron, polyvinyl alcohol, sodium chloride    Boluwatife Flight N 08/09/2011,10:15 AM

## 2011-08-09 NOTE — Progress Notes (Signed)
Dr. Izola Price responded back . To closely monitor the pt . To give her a call if pt is symptomatic

## 2011-08-10 ENCOUNTER — Inpatient Hospital Stay (HOSPITAL_COMMUNITY)

## 2011-08-10 LAB — BASIC METABOLIC PANEL
CO2: 31 mEq/L (ref 19–32)
Chloride: 86 mEq/L — ABNORMAL LOW (ref 96–112)
Sodium: 129 mEq/L — ABNORMAL LOW (ref 135–145)

## 2011-08-10 LAB — CBC
MCV: 85.8 fL (ref 78.0–100.0)
Platelets: 168 10*3/uL (ref 150–400)
RBC: 4.59 MIL/uL (ref 4.22–5.81)
WBC: 9.9 10*3/uL (ref 4.0–10.5)

## 2011-08-10 LAB — PROTIME-INR: Prothrombin Time: 29.7 seconds — ABNORMAL HIGH (ref 11.6–15.2)

## 2011-08-10 MED ORDER — MOXIFLOXACIN HCL 400 MG PO TABS: 400.0000 mg | ORAL_TABLET | Freq: Every day | ORAL | Status: AC

## 2011-08-10 MED ORDER — WARFARIN SODIUM 5 MG PO TABS
5.0000 mg | ORAL_TABLET | Freq: Once | ORAL | Status: DC
  Filled 2011-08-10: qty 1

## 2011-08-10 MED ORDER — NYSTATIN 100000 UNIT/ML MT SUSP: 500000.0000 [IU] | Freq: Four times a day (QID) | OROMUCOSAL | Status: AC

## 2011-08-10 NOTE — Discharge Summary (Signed)
Patient ID: Ian Williams MRN: [id number] DOB/AGE: [birth year] 78 y.o.  Admit date: 08/08/2011 Discharge date: 08/10/2011  Primary Care Physician:  Ezequiel Kayser, MD, MD  Discharge Diagnoses:    Present on Admission:  .Biventricular ICD  -LV lead off secondary to increased dyspnea .Heart failure, systolic, chronic .Atrial fibrillation .Ischemic cardiomyopathy .HYPOTHYROIDISM .Hypertension  Principal Problem:  *Heart failure, systolic, chronic Active Problems:  Atrial fibrillation  Dyspnea  Hypertension  HYPOTHYROIDISM  Biventricular ICD  -LV lead off secondary to increased dyspnea  Ischemic cardiomyopathy   Medication List  As of 08/10/2011  8:33 AM   TAKE these medications         acetaminophen 325 MG tablet   Commonly known as: TYLENOL   Take 650 mg by mouth 2 (two) times daily as needed. For pain      digoxin 0.125 MG tablet   Commonly known as: LANOXIN   Take 62.5 mcg by mouth daily. Marland Kitchen      ezetimibe 10 MG tablet   Commonly known as: ZETIA   Take 5 mg by mouth daily.      famotidine 20 MG tablet   Commonly known as: PEPCID   Take 20 mg by mouth daily.      Fish Oil 1000 MG Caps   Take 1 capsule by mouth 2 (two) times daily.      furosemide 80 MG tablet   Commonly known as: LASIX   Take 40 mg by mouth 2 (two) times daily.      hydroxypropyl methylcellulose 2.5 % ophthalmic solution   Commonly known as: ISOPTO TEARS   Place 1 drop into both eyes 2 (two) times daily as needed. Opti fresh eye drops      levothyroxine 100 MCG tablet   Commonly known as: SYNTHROID, LEVOTHROID   Take 100-150 mcg by mouth daily. Take 1 tablet everyday except on Wednesdays and Fridays take 1.5 tablets      metolazone 2.5 MG tablet   Commonly known as: ZAROXOLYN   Take 2.5 mg by mouth every 7 (seven) days. Take 1 tablet every Friday      metoprolol 100 MG tablet   Commonly known as: LOPRESSOR   Take 0.5 tablets (50 mg total) by mouth 2 (two) times daily.     moxifloxacin 400 MG tablet   Commonly known as: AVELOX   Take 1 tablet (400 mg total) by mouth daily.      niacin 500 MG CR tablet   Commonly known as: NIASPAN   Take 1 tablet (500 mg total) by mouth at bedtime.      nystatin 100000 UNIT/ML suspension   Commonly known as: MYCOSTATIN   Take 5 mLs (500,000 Units total) by mouth 4 (four) times daily.      OCUVITE ADULT 50+ Caps   Take 1 capsule by mouth 2 (two) times daily.      CVS SPECTRAVITE ADVANCED PO   Take 1 tablet by mouth daily.      ramipril 2.5 MG capsule   Commonly known as: ALTACE   Take 1 capsule (2.5 mg total) by mouth 2 (two) times daily.      spironolactone 25 MG tablet   Commonly known as: ALDACTONE   Take 1 tablet (25 mg total) by mouth daily.      warfarin 5 MG tablet   Commonly known as: COUMADIN   Take 2.5-5 mg by mouth daily. Take 1 tablet all days except Tuesday take 0.5 tablet  ZYLOPRIM 300 MG tablet   Generic drug: allopurinol   Take 300 mg by mouth daily.            Disposition and Follow-up: Cardiology, and pt will be called for an appointment  Consults: none  Significant Diagnostic Studies:  Dg Chest Port 1 View  08/08/2011  *RADIOLOGY REPORT*  Clinical Data: Dyspnea on exertion  PORTABLE CHEST - 1 VIEW  Comparison: 06/09/2011  Findings: Previous CABG.  Left subclavian AICD stable.  Moderate cardiomegaly as before.  New perihilar and bibasilar interstitial and airspace edema or infiltrates with pulmonary vascular congestion.  No definite effusion.  IMPRESSION:  1.  Moderate cardiomegaly with worsening bilateral edema or infiltrates.  Original Report Authenticated By: Osa Craver, M.D.    Brief H and P: Ian Williams is an 78 y.o. male with severe coronary disease, ischemic cardiomyopathy status post coronary bypass graft in August 2011, congestive heart failure with ejection fraction of 20% with septal akinesis and global hypokinesis, paroxysmal atrial fibrillation on chronic  anticoagulation, status post cardiac arrest, status post ICD implantation, peripheral vascular disease, carotid disease status post left endarterectomy, diabetes, presents to [facility] emergency room because of substernal chest pressure lasting over an hour. He also experienced shortness of breath for the past week. He has some cough which had thick and green sputum (the ER note stated clear sputum), but no fever or chills. He has noted orthopnea or PND, and he has been able to sleep all night without shortness of breath. He has had no calf tenderness or leg swelling. Evaluation in emergency room included a BNP of 12,000, but it should be noted that it was not a change 3 months ago. Prior to that, his BNP level was quite low. His chest x-ray shows question of congestion versus consolidation with no effusion. He has a normal white count of 9.7 thousand and hemoglobin of 14 g per decaliter. His EKG shows several paced complexes, and his cardiac markers were unremarkable. Hospitalist was asked to admit the patient for further evaluation of his shortness of breath.   Physical Exam on Discharge:  Filed Vitals:   08/09/11 2124 08/09/11 2125 08/09/11 2231 08/10/11 0447  BP: 102/62  91/54 96/57  Pulse:  89 85 83  Temp:   97.5 F (36.4 C) 97.9 F (36.6 C)  TempSrc:   Oral Oral  Resp:   20 22  Height:      Weight:    61.6 kg (135 lb 12.9 oz)  SpO2:   100% 98%     Intake/Output Summary (Last 24 hours) at 08/10/11 1610 Last data filed at 08/10/11 0544  Gross per 24 hour  Intake   1540 ml  Output   1600 ml  Net    -60 ml    General: Alert, awake, oriented x3, in no acute distress. HEENT: No bruits, no goiter. Heart: Regular rate and rhythm, without murmurs, rubs, gallops. Lungs: Clear to auscultation bilaterally with minimal bibasilar crackles Abdomen: Soft, nontender, nondistended, positive bowel sounds. Extremities: No clubbing cyanosis or edema with positive pedal pulses. Neuro: Grossly  intact, nonfocal.  CBC:    Component Value Date/Time   WBC 9.9 08/10/2011 0500   HGB 12.9* 08/10/2011 0500   HCT 39.4 08/10/2011 0500   PLT 168 08/10/2011 0500   MCV 85.8 08/10/2011 0500   NEUTROABS 8.0* 08/08/2011 0021   LYMPHSABS 0.8 08/08/2011 0021   MONOABS 0.8 08/08/2011 0021   EOSABS 0.0 08/08/2011 0021   BASOSABS  0.0 08/08/2011 0021    Basic Metabolic Panel:    Component Value Date/Time   NA 129* 08/10/2011 0500   K 3.7 08/10/2011 0500   CL 86* 08/10/2011 0500   CO2 31 08/10/2011 0500   BUN 39* 08/10/2011 0500   CREATININE 1.21 08/10/2011 0500   GLUCOSE 107* 08/10/2011 0500   CALCIUM 9.1 08/10/2011 0500    Hospital Course:   Principal Problem:  *Heart failure, systolic, chronic  - now clinically improved but 2 D ECHO shows 20 % EF  - will continue lasix PO as per home regimen as pt appears to be adequately diuresed on high dose of lasix  - pt will have to follow up with cardiology within this week - cardiology service notified  Active Problems:  Atrial fibrillation  - rate controlled  - continue coumadin and digoxin   Dyspnea  - most likely multifactorial, PNA and volume overload  - continue abx (Avelox day 3/7) and Lasix  - provide script upon discharge  Hypertension  - stable  - continue home medication regimen   HYPOTHYROIDISM  - TSH is WNL  - continue synthroid   Biventricular ICD -LV lead off secondary to increased dyspnea   Ischemic cardiomyopathy   EDUCATION  - test results and diagnostic studies were discussed with patient  - patient verbalized the understanding  - questions were answered at the bedside and contact information was provided for additional questions or concerns   Time spent on Discharge: Over 30 minutes  Signed: Debbora Presto 08/10/2011, 8:33 AM  [phone]

## 2011-08-10 NOTE — Progress Notes (Signed)
[id number] [facility] patient Related admit.  Patient preparing for discharge home currently awaiting son to transport him home.  He states his pneumonia is resolving and has prescript for Avelox and feels much better.  We discussed resuming his [facility] homecare team staff visits.  This  On call Sw notified triage nurse and left vm for staff.Jenna Luo, LCSW [facility] [phone]

## 2011-08-10 NOTE — Progress Notes (Signed)
Pt's family - wife and son came . Prior to D/C pt wanted all meds gone over with wife since she gives them to him.Went over about follow-up appts and med rec. Given prescription -family aware they must fill this @ pharmacy and give to pt tonight. All belongings with pt . Given extra weight chart to take home to follow up with daily weights. D/C per w/c accompanied by nurse tech and family.     Marisa Cyphers RN

## 2011-08-10 NOTE — Progress Notes (Signed)
ANTICOAGULATION CONSULT NOTE - Follow Up Consult  Pharmacy Consult for Coumadin Indication: atrial fibrillation  Assessment: Ian Williams is a 78 year old male on chronic coumadin therapy for afib. INR today is 2.77 (down from 3.64 after held dose x1). Noted patient on avelox which can increase INR.  Patient's INR goal is 2-3. Home dose of coumadin is 5mg  daily except 2.5mg  on Tuesday.   CBC is ok and no bleeding has been documented in the notes.   Goal of Therapy:  INR 2-3   Plan:  1. Coumadin 5 mg po x 1 dose tonight at 1800 2. F/u INR in AM   Thank you,  Brett Fairy, PharmD Pager: [phone]  08/10/2011 9:35 AM   Allergies  Allergen Reactions  . Penicillins Other (See Comments)    Unknown reaction  . Sulfonamide Derivatives Other (See Comments)    Unknown reaction    Patient Measurements: Height: 6' (182.9 cm) Weight: 135 lb 12.9 oz (61.6 kg) (scale (a)) IBW/kg (Calculated) : 77.6   Vital Signs: Temp: 97.9 F (36.6 C) (02/10 0447) Temp src: Oral (02/10 0447) BP: 96/57 mmHg (02/10 0447) Pulse Rate: 83  (02/10 0447)  Labs:  Basename 08/10/11 0500 08/09/11 0930 08/09/11 0530 08/08/11 1953 08/08/11 1112 08/08/11 0607 08/08/11 0347  HGB 12.9* -- 11.8* -- -- -- --  HCT 39.4 -- 36.8* -- -- 38.5* --  PLT 168 -- 135* -- -- 136* --  APTT -- -- -- -- -- -- --  LABPROT 29.7* -- 36.8* -- -- 32.9* --  INR 2.77* -- 3.64* -- -- 3.16* --  HEPARINUNFRC -- -- -- -- -- -- --  CREATININE 1.21 0.98 1.00 -- -- -- --  CKTOTAL -- -- -- 36 28 -- 41  CKMB -- -- -- 2.1 2.0 -- 2.5  TROPONINI -- -- -- <0.30 <0.30 -- <0.30   Estimated Creatinine Clearance: 43.8 ml/min (by C-G formula based on Cr of 1.21).   Medications:  Scheduled:    . allopurinol  300 mg Oral Daily  . digoxin  62.5 mcg Oral Daily  . ezetimibe  5 mg Oral Daily  . famotidine  20 mg Oral Daily  . furosemide  40 mg Oral BID  . isosorbide mononitrate  30 mg Oral Daily  . levothyroxine  100 mcg Oral Custom  .  levothyroxine  150 mcg Oral Custom  . metolazone  2.5 mg Oral Q7 days  . metoprolol  50 mg Oral BID  . moxifloxacin  400 mg Intravenous QHS  . niacin  500 mg Oral QHS  . omega-3 acid ethyl esters  1 g Oral BID  . ramipril  2.5 mg Oral BID  . sodium chloride  3 mL Intravenous Q12H  . spironolactone  25 mg Oral Daily  . DISCONTD: furosemide  40 mg Intravenous Daily   Infusions:   PRN: sodium chloride, acetaminophen, morphine, ondansetron (ZOFRAN) IV, ondansetron, polyvinyl alcohol, sodium chloride  Ian Williams N 08/10/2011,9:30 AM

## 2011-08-10 NOTE — Progress Notes (Signed)
Went over all discharge information including follow up appts and med rec. Pt alert and oriented. Called son to pick him up for discharge.  Marisa Cyphers RN

## 2011-08-11 ENCOUNTER — Telehealth: Payer: Self-pay | Admitting: Cardiology

## 2011-08-11 ENCOUNTER — Encounter: Payer: Self-pay | Admitting: *Deleted

## 2011-08-11 ENCOUNTER — Ambulatory Visit (INDEPENDENT_AMBULATORY_CARE_PROVIDER_SITE_OTHER): Payer: Self-pay | Admitting: Cardiology

## 2011-08-11 DIAGNOSIS — R0989 Other specified symptoms and signs involving the circulatory and respiratory systems: Secondary | ICD-10-CM

## 2011-08-11 DIAGNOSIS — I4891 Unspecified atrial fibrillation: Secondary | ICD-10-CM

## 2011-08-11 LAB — POCT INR: INR: 1.7

## 2011-08-11 NOTE — Telephone Encounter (Signed)
Per Hospice nurse - pt's weight is up 4.5 lbs since last Wednesday.  Was 134.5 and now 139, feet and ankles are swollen and he is SOB when talking.  She would like orders for medication changes.  BUN and Crea were elevated somewhat on 2/10.  Will forward to MD for orders.

## 2011-08-11 NOTE — Telephone Encounter (Signed)
Take Zaroxolyn 2.5 mg today and tomorrow.  Check BMET on Thursday.

## 2011-08-11 NOTE — Telephone Encounter (Signed)
Left message for Wendy to call back.  

## 2011-08-11 NOTE — Telephone Encounter (Signed)
New problem   [phone]  Calling to report patient had 4.5 lb weight gain  Over the last week.  Please return call to [facility] ASAP

## 2011-08-11 NOTE — Progress Notes (Signed)
08/11/2011 Ian Williams SPARKS Case Management Note [phone]  Utilization review completed.  

## 2011-08-11 NOTE — Telephone Encounter (Signed)
Toniann Fail aware of orders and will contact pt.  She will repeat labs as ordered.

## 2011-08-12 ENCOUNTER — Encounter: Payer: Self-pay | Admitting: Internal Medicine

## 2011-08-12 ENCOUNTER — Telehealth: Payer: Self-pay | Admitting: Physician Assistant

## 2011-08-12 ENCOUNTER — Ambulatory Visit (INDEPENDENT_AMBULATORY_CARE_PROVIDER_SITE_OTHER): Payer: Medicare Other | Admitting: *Deleted

## 2011-08-12 ENCOUNTER — Ambulatory Visit (HOSPITAL_COMMUNITY)
Admission: RE | Admit: 2011-08-12 | Discharge: 2011-08-12 | Disposition: A | Discharge status: Home / Self Care | Payer: Medicare Other | Source: Ambulatory Visit | Attending: Internal Medicine | Admitting: Internal Medicine

## 2011-08-12 VITALS — BP 84/56 | HR 100 | Wt 137.8 lb

## 2011-08-12 DIAGNOSIS — I5022 Chronic systolic (congestive) heart failure: Secondary | ICD-10-CM | POA: Insufficient documentation

## 2011-08-12 DIAGNOSIS — I255 Ischemic cardiomyopathy: Secondary | ICD-10-CM

## 2011-08-12 DIAGNOSIS — I2589 Other forms of chronic ischemic heart disease: Secondary | ICD-10-CM

## 2011-08-12 DIAGNOSIS — I472 Ventricular tachycardia: Secondary | ICD-10-CM

## 2011-08-12 MED ORDER — METOPROLOL TARTRATE 100 MG PO TABS: 25.0000 mg | ORAL_TABLET | Freq: Two times a day (BID) | ORAL | Status: DC

## 2011-08-12 MED ORDER — METOPROLOL TARTRATE 25 MG PO TABS: 25.0000 mg | ORAL_TABLET | Freq: Two times a day (BID) | ORAL | Status: AC

## 2011-08-12 NOTE — Patient Instructions (Addendum)
Take Metoprolol 25 mg twice a day  [facility] to obtain BMET on 08/15/11 and fax results to HF clinic [phone]  Do the following things EVERYDAY: 1) Weigh yourself in the morning before breakfast. Write it down and keep it in a log. 2) Take your medicines as prescribed 3) Eat low salt foods-Limit salt (sodium) to 2000mg  per day.  4) Stay as active as you can everyday  Follow up in 2 weeks.

## 2011-08-12 NOTE — Progress Notes (Signed)
Patient ID: Ian Williams, male   DOB: 1931-06-09, 78 y.o.   MRN: [id number]  HPI:  78 year old male ischemic cardiomyopathy, chronic systolic heart failure EF 15% followed by Dr. Antoine Poche and Dr. Darcus Austin.   PMHx notable - AF s/p DC-CV 7/12, CAD s/p CABG, gout and hypothyroid. S/p BiVICD medtronic MI - LV lead turned off due to dyspnea  Initially seen by Dr. Antoine Poche and weight was up. Treated with metolazone for several days with marked response. Referred for HF clinic for further management.  His lasix was decreased from 80/40 to 40/40 due to orthostasis.  Volumes status mildly elevated last visit and lasix doubled for 1 day.   2/8 Pro BNP 11,814  2/10 Creatinine 1.21 BUN 39  Returns for follow up today. Recent discharge 2/10  from [facility] and was treated for pneumonia.  Prior to hospitalization he was SOB  And had productive cough. On day 2/10 Avelox. Weight at home increased to 139 and he was instructed to take Metolazone 2.5 mg daily  2/11 and 2/12. Denies SOB/CP/PND/CP.  Followed by Hospice twice a week.   Appetite poor per wife. Uses oxygen at night.     ROS: All other systems normal except as mentioned in HPI, past medical history and problem list.    Past Medical History  Diagnosis Date  . Ischemic cardiomyopathy 02/28/00    CABG;8/11 Echo: EF=20-25% with septal akinesia and global hypokinesia and and LV systolic dysfunction.10/08 Echo: EF=35-40% with  LV dysfunction  . Paroxysmal atrial fibrillation     Referred at at time of ICD implantation-now on amiodarone  . Peripheral vascular disease     Iliac disease and mesenteric disease  . Carotid arterial disease     Left Carotid Endartectomy  . Cardiac arrest 02/24/2000  . Hypertension   . Long term current use of anticoagulant   . History of kidney stones   . Hyperlipidemia   . Dual implantable cardiac defibrillator in situ 04/2010    Revision 06/2010 -Medtronic  . Gout   . Diabetes mellitus type II   . DJD (degenerative joint  disease)   . BPH (benign prostatic hyperplasia)   . CHF (congestive heart failure)     Current Outpatient Prescriptions  Medication Sig Dispense Refill  . acetaminophen (TYLENOL) 325 MG tablet Take 650 mg by mouth 2 (two) times daily as needed. For pain      . allopurinol (ZYLOPRIM) 300 MG tablet Take 300 mg by mouth daily.        . digoxin (LANOXIN) 0.125 MG tablet Take 62.5 mcg by mouth daily. .      . ezetimibe (ZETIA) 10 MG tablet Take 5 mg by mouth daily.       . famotidine (PEPCID) 20 MG tablet Take 20 mg by mouth daily.        . furosemide (LASIX) 80 MG tablet Take 40 mg by mouth 2 (two) times daily.      . hydroxypropyl methylcellulose (ISOPTO TEARS) 2.5 % ophthalmic solution Place 1 drop into both eyes 2 (two) times daily as needed. Opti fresh eye drops       . levothyroxine (SYNTHROID, LEVOTHROID) 100 MCG tablet Take 100-150 mcg by mouth daily. Take 1 tablet everyday except on Wednesdays and Fridays take 1.5 tablets      . metolazone (ZAROXOLYN) 2.5 MG tablet Take 2.5 mg by mouth every 7 (seven) days. Take 1 tablet every Friday      . metoprolol (LOPRESSOR) 100 MG tablet  Take 0.5 tablets (50 mg total) by mouth 2 (two) times daily.  60 tablet  9  . moxifloxacin (AVELOX) 400 MG tablet Take 1 tablet (400 mg total) by mouth daily.  5 tablet  0  . Multiple Vitamins-Minerals (CVS SPECTRAVITE ADVANCED PO) Take 1 tablet by mouth daily.        . Multiple Vitamins-Minerals (OCUVITE ADULT 50+) CAPS Take 1 capsule by mouth 2 (two) times daily.        . niacin (NIASPAN) 500 MG CR tablet Take 1 tablet (500 mg total) by mouth at bedtime.  90 tablet  6  . nystatin (MYCOSTATIN) 100000 UNIT/ML suspension Take 5 mLs (500,000 Units total) by mouth 4 (four) times daily.  120 mL  0  . Omega-3 Fatty Acids (FISH OIL) 1000 MG CAPS Take 1 capsule by mouth 2 (two) times daily.        . ramipril (ALTACE) 2.5 MG capsule Take 1 capsule (2.5 mg total) by mouth 2 (two) times daily.  60 capsule  3  . spironolactone  (ALDACTONE) 25 MG tablet Take 1 tablet (25 mg total) by mouth daily.  30 tablet  6  . warfarin (COUMADIN) 5 MG tablet Take 2.5-5 mg by mouth daily. Take 1 tablet all days except Tuesday take 0.5 tablet      . DISCONTD: metolazone (ZAROXOLYN) 2.5 MG tablet Take 1 tablet (2.5 mg total) by mouth daily.  30 tablet  0  . DISCONTD: spironolactone (ALDACTONE) 25 MG tablet Take 0.5 tablets (12.5 mg total) by mouth daily.  30 tablet  6     Allergies  Allergen Reactions  . Penicillins Other (See Comments)    Unknown reaction  . Sulfonamide Derivatives Other (See Comments)    Unknown reaction    History   Social History  . Marital Status: Married    Spouse Name: N/A    Number of Children: 10  . Years of Education: N/A   Occupational History  .     Social History Main Topics  . Smoking status: Former Smoker -- 1.0 packs/day    Types: Cigarettes    Quit date: 07/01/1999  . Smokeless tobacco: Never Used  . Alcohol Use: No     previously drank 2 drinks and a pack of cigarettes per day but has stopped since 2001  . Drug Use: No  . Sexually Active: Not on file   Other Topics Concern  . Not on file   Social History Narrative  . No narrative on file    Family History  Problem Relation Age of Onset  . Stroke Father   . Cancer Mother   . Cardiomyopathy Brother     PHYSICAL EXAM: Filed Vitals:   08/12/11 1123  BP: 84/56  Pulse: 100     General:  Frail appearing. No respiratory difficulty wife present HEENT: normal Neck: supple. JVP 10  Carotids 2+ bilat; no bruits. No lymphadenopathy or thryomegaly appreciated. Cor: PMI nondisplaced. Iregular rate & rhythm. S3 Lungs: decreased LLL Abdomen: soft, nontender, nondistended. Liver edge palpable  No bruits or masses. Good bowel sounds. Extremities: no cyanosis, clubbing, rash, R and LLE 2+  edema, compression stockings. Neuro: alert & oriented x 3, cranial nerves grossly intact. moves all 4 extremities w/o difficulty. Affect  pleasant.    ASSESSMENT & PLAN:

## 2011-08-12 NOTE — Telephone Encounter (Signed)
Paged regarding Medtronic CareLink alert system. Patient with Bi-V ICD implanted last year. Upon calling representative, alert summary included: VF detection off, AT/AF burden > threshold, V pacing <90%, 29 V sensing episodes and 9 hours of AT/AF since last episode per rep. Patient had followed-up in HF clinic today with Dr. Gala Romney. Upon reviewing note, LV lead was turned off due complaint of dyspnea. Called patient's wife to inquire as to if there had been any changes to the patient's symptoms since being seen earlier today, she stated that he is at baseline, without changes, and "feels fine." They are aware of changes made to ICD today.   Jacqulyn Bath, PA-C 08/12/2011 10:19 PM

## 2011-08-12 NOTE — Assessment & Plan Note (Addendum)
NYHA IIIB-IV. Neck veins still up a bit. He continues to decline. SBP soft due low output.  Will decrease Metoprolol 25 mg twice a day. He is taking additional Metolazone today per Dr Lindaann Slough instruction. Continue Hospice Services for symptom management.  Discussed turning off defibrillator.  They are agreeable to turn off defibrillator. Defibrillator turned off. Medtronic contacted. Check BMET to be obtained by [facility] Friday. He will follow up in 2 weeks.   Patient seen and examined with Tonye Becket, NP. We discussed all aspects of the encounter. I agree with the assessment and plan as stated above.  Neck veins up but we reviewed Optivol and volume status looks ok. He continues to decline with essentially NYHA IV symptoms and hypotension. Will cut back b-blocker. Continue to have Hospice follow. We had a long talk about his ICD and it was deactivated today in clinic. Total MD time spent = 50 mins with over 70% of that time dedicated to counseling and discussions described above.

## 2011-08-15 ENCOUNTER — Ambulatory Visit (INDEPENDENT_AMBULATORY_CARE_PROVIDER_SITE_OTHER): Payer: Self-pay | Admitting: Cardiology

## 2011-08-15 DIAGNOSIS — R0989 Other specified symptoms and signs involving the circulatory and respiratory systems: Secondary | ICD-10-CM

## 2011-08-15 DIAGNOSIS — I4891 Unspecified atrial fibrillation: Secondary | ICD-10-CM

## 2011-08-15 LAB — POCT INR: INR: 1.9

## 2011-08-17 LAB — REMOTE ICD DEVICE
AL AMPLITUDE: 0.5 mv
AL THRESHOLD: 0.75 V
BATTERY VOLTAGE: 3.1116 V
LV LEAD IMPEDENCE ICD: 4047 Ohm
RV LEAD IMPEDENCE ICD: 437 Ohm
RV LEAD THRESHOLD: 1 V
TOT-0006: 20120106000000
TZAT-0001ATACH: 1
TZAT-0001ATACH: 2
TZAT-0001SLOWVT: 1
TZAT-0002ATACH: NEGATIVE
TZAT-0002ATACH: NEGATIVE
TZAT-0002ATACH: NEGATIVE
TZAT-0011SLOWVT: 10 ms
TZAT-0012ATACH: 150 ms
TZAT-0012ATACH: 150 ms
TZAT-0012FASTVT: 200 ms
TZAT-0018ATACH: NEGATIVE
TZAT-0018ATACH: NEGATIVE
TZAT-0018ATACH: NEGATIVE
TZAT-0019ATACH: 6 V
TZAT-0019ATACH: 6 V
TZAT-0019SLOWVT: 8 V
TZAT-0020FASTVT: 1.5 ms
TZAT-0020SLOWVT: 1.5 ms
TZON-0003ATACH: 350 ms
TZST-0001ATACH: 4
TZST-0001ATACH: 5
TZST-0001FASTVT: 5
TZST-0001SLOWVT: 2
TZST-0001SLOWVT: 4
TZST-0001SLOWVT: 6
TZST-0002ATACH: NEGATIVE
TZST-0002ATACH: NEGATIVE
TZST-0002FASTVT: NEGATIVE
TZST-0002FASTVT: NEGATIVE
TZST-0002FASTVT: NEGATIVE
TZST-0003SLOWVT: 20 J
TZST-0003SLOWVT: 35 J
VF: 0

## 2011-08-22 ENCOUNTER — Telehealth: Payer: Self-pay | Admitting: Internal Medicine

## 2011-08-22 ENCOUNTER — Ambulatory Visit: Payer: Self-pay | Admitting: Cardiovascular Disease

## 2011-08-22 DIAGNOSIS — I4891 Unspecified atrial fibrillation: Secondary | ICD-10-CM

## 2011-08-22 NOTE — Progress Notes (Signed)
ICD remote with ICM 

## 2011-08-22 NOTE — Telephone Encounter (Signed)
New   Iris Pert Hospice Nurse [phone]  Called to report patient has crackles in lower left lung Tuesday, this problem has since spread to mide section as of 08/22/11.  She can be reached at the number above should you have any questions

## 2011-08-22 NOTE — Telephone Encounter (Signed)
Wt 135.5, stable for him. O2 sat 99% on room air, productive cough with gray sputum, and R 22.  Wife states he is not feeling any different. Recently in hospital for questionable pneumonia.  Only 1 sided and distinct crackles.  Per Toniann Fail, hospice nurse.  Will forward to Dr Gala Romney for review

## 2011-08-22 NOTE — Telephone Encounter (Signed)
Did not realize Dr Gala Romney on vacation.  Did discuss with Dr Elease Hashimoto.  He takes Zaroxolyn 1 x a week and that is today.  Since he is asymptomatic, will not make any changes.  Spoke with nurse and she will recheck Monday.

## 2011-08-27 ENCOUNTER — Ambulatory Visit (HOSPITAL_COMMUNITY)
Admission: RE | Admit: 2011-08-27 | Discharge: 2011-08-27 | Disposition: A | Discharge status: Home / Self Care | Payer: Medicare Other | Source: Ambulatory Visit | Attending: Internal Medicine | Admitting: Internal Medicine

## 2011-08-27 ENCOUNTER — Encounter: Payer: Self-pay | Admitting: *Deleted

## 2011-08-27 VITALS — BP 80/48 | HR 97 | Wt 134.8 lb

## 2011-08-27 DIAGNOSIS — I5022 Chronic systolic (congestive) heart failure: Secondary | ICD-10-CM

## 2011-08-27 NOTE — Patient Instructions (Signed)
Do the following things EVERYDAY: 1) Weigh yourself in the morning before breakfast. Write it down and keep it in a log. 2) Take your medicines as prescribed 3) Eat low salt foods--Limit salt (sodium) to 2000mg  per day.  4) Stay as active as you can everyday  Follow up in one month

## 2011-08-27 NOTE — Assessment & Plan Note (Addendum)
NYHA IIIb. Volume status remains minimally elevated. Continue current diuretic regimen. Continue Hospice services as he is end stage Heart Failure. Will follow  once a month.   Patient seen and examined with Tonye Becket, NP. We discussed all aspects of the encounter. I agree with the assessment and plan as stated above. Overall stable, if not a little improved. Volume status better on exam. Continue supportive care. ICD has been deactivated.

## 2011-08-27 NOTE — Progress Notes (Signed)
Patient ID: Ian Williams, male   DOB: 1930/08/08, 78 y.o.   MRN: [id number] HPI:  78 year old male ischemic cardiomyopathy, chronic systolic heart failure EF 15% followed by Dr. Antoine Poche and Dr. Darcus Austin.   PMHx notable - AF s/p DC-CV 7/12, CAD s/p CABG, gout and hypothyroid. S/p BiVICD medtronic MI - LV lead turned off due to dyspnea  Initially seen by Dr. Antoine Poche and weight was up. Treated with metolazone for several days with marked response. Referred for HF clinic for further management.  His lasix was decreased from 80/40 to 40/40 due to orthostasis.  Volumes status mildly elevated last visit and lasix doubled for 1 day.   2/8 Pro BNP 11,814  2/10 Creatinine 1.21 BUN 39   ICH deactivated 08/11/11  Returns for follow up today. Recent discharge 2/10  from [facility] and was treated for pneumonia. Poor appetite.  Hospice following 2x per week.  Prior to hospitalization he was SOB  And had productive cough. On day 2/10 Avelox. Weight at home 129-137. He continues to take Metolazone 2.5 mg every Friday. Sleeps 10 to 12 hours per day.  Denies SOB/CP/PND/CP.   Appetite poor per wife. Uses oxygen at night.     ROS: All other systems normal except as mentioned in HPI, past medical history and problem list.    Past Medical History  Diagnosis Date  . Ischemic cardiomyopathy 02/28/00    CABG;8/11 Echo: EF=20-25% with septal akinesia and global hypokinesia and and LV systolic dysfunction.10/08 Echo: EF=35-40% with  LV dysfunction  . Paroxysmal atrial fibrillation     Referred at at time of ICD implantation-now on amiodarone  . Peripheral vascular disease     Iliac disease and mesenteric disease  . Carotid arterial disease     Left Carotid Endartectomy  . Cardiac arrest 02/24/2000  . Hypertension   . Long term current use of anticoagulant   . History of kidney stones   . Hyperlipidemia   . Dual implantable cardiac defibrillator in situ 04/2010    Revision 06/2010 -Medtronic  . Gout   . Diabetes  mellitus type II   . DJD (degenerative joint disease)   . BPH (benign prostatic hyperplasia)   . CHF (congestive heart failure)     Current Outpatient Prescriptions  Medication Sig Dispense Refill  . acetaminophen (TYLENOL) 325 MG tablet Take 650 mg by mouth 2 (two) times daily as needed. For pain      . allopurinol (ZYLOPRIM) 300 MG tablet Take 300 mg by mouth daily.        . digoxin (LANOXIN) 0.125 MG tablet Take 62.5 mcg by mouth daily. .      . ezetimibe (ZETIA) 10 MG tablet Take 5 mg by mouth daily.       . famotidine (PEPCID) 20 MG tablet Take 20 mg by mouth daily.        . furosemide (LASIX) 80 MG tablet Take 40 mg by mouth 2 (two) times daily.      . hydroxypropyl methylcellulose (ISOPTO TEARS) 2.5 % ophthalmic solution Place 1 drop into both eyes 2 (two) times daily as needed. Opti fresh eye drops       . levothyroxine (SYNTHROID, LEVOTHROID) 100 MCG tablet Take 100-150 mcg by mouth daily. Take 1 tablet everyday except on Wednesdays and Fridays take 1.5 tablets      . metolazone (ZAROXOLYN) 2.5 MG tablet Take 2.5 mg by mouth every 7 (seven) days. Take 1 tablet every Friday      .  metoprolol (LOPRESSOR) 25 MG tablet Take 1 tablet (25 mg total) by mouth 2 (two) times daily.  60 tablet  9  . Multiple Vitamins-Minerals ([employer] SPECTRAVITE ADVANCED PO) Take 1 tablet by mouth daily.        . Multiple Vitamins-Minerals (OCUVITE ADULT 50+) CAPS Take 1 capsule by mouth 2 (two) times daily.        . niacin (NIASPAN) 500 MG CR tablet Take 1 tablet (500 mg total) by mouth at bedtime.  90 tablet  6  . Omega-3 Fatty Acids (FISH OIL) 1000 MG CAPS Take 1 capsule by mouth 2 (two) times daily.        . ramipril (ALTACE) 2.5 MG capsule Take 1 capsule (2.5 mg total) by mouth 2 (two) times daily.  60 capsule  3  . spironolactone (ALDACTONE) 25 MG tablet Take 1 tablet (25 mg total) by mouth daily.  30 tablet  6  . warfarin (COUMADIN) 5 MG tablet Take 2.5-5 mg by mouth daily. Take 1 tablet all days except  Tuesday take 0.5 tablet      . DISCONTD: metolazone (ZAROXOLYN) 2.5 MG tablet Take 1 tablet (2.5 mg total) by mouth daily.  30 tablet  0  . DISCONTD: spironolactone (ALDACTONE) 25 MG tablet Take 0.5 tablets (12.5 mg total) by mouth daily.  30 tablet  6     Allergies  Allergen Reactions  . Penicillins Other (See Comments)    Unknown reaction  . Sulfonamide Derivatives Other (See Comments)    Unknown reaction    History   Social History  . Marital Status: Married    Spouse Name: N/A    Number of Children: 10  . Years of Education: N/A   Occupational History  .     Social History Main Topics  . Smoking status: Former Smoker -- 1.0 packs/day    Types: Cigarettes    Quit date: 07/01/1999  . Smokeless tobacco: Never Used  . Alcohol Use: No     previously drank 2 drinks and a pack of cigarettes per day but has stopped since 2001  . Drug Use: No  . Sexually Active: Not on file   Other Topics Concern  . Not on file   Social History Narrative  . No narrative on file    Family History  Problem Relation Age of Onset  . Stroke Father   . Cancer Mother   . Cardiomyopathy Brother     PHYSICAL EXAM: Filed Vitals:   08/27/11 1301  BP: 80/48  Pulse: 97     General:  Frail appearing. No respiratory difficulty wife present HEENT: normal Neck: supple. JVP 9-10   Carotids 2+ bilat; no bruits. No lymphadenopathy or thryomegaly appreciated. Cor: PMI nondisplaced. Iregular rate & rhythm. S3 Lungs: Clear  Abdomen: soft, nontender, nondistended. Liver edge palpable  No bruits or masses. Good bowel sounds. Extremities: no cyanosis, clubbing, rash, RLE 1+ and LLE 2+  edema, compression stockings. Neuro: alert & oriented x 3, cranial nerves grossly intact. moves all 4 extremities w/o difficulty. Affect pleasant.    ASSESSMENT & PLAN:

## 2011-08-28 ENCOUNTER — Ambulatory Visit: Payer: Medicare Other | Admitting: Nurse Practitioner

## 2011-08-29 ENCOUNTER — Telehealth (HOSPITAL_COMMUNITY): Payer: Self-pay | Admitting: *Deleted

## 2011-08-29 ENCOUNTER — Ambulatory Visit: Payer: Self-pay | Admitting: Internal Medicine

## 2011-08-29 DIAGNOSIS — I4891 Unspecified atrial fibrillation: Secondary | ICD-10-CM

## 2011-08-29 NOTE — Telephone Encounter (Signed)
Coumadin clinic took care of INR results.  See anti-coag visit for details.

## 2011-08-29 NOTE — Telephone Encounter (Signed)
Ian Williams called today regarding Mr Rennert, his INR was 3.1 and his PT was 37.7 today. Please follow up, Thanks.

## 2011-09-05 ENCOUNTER — Telehealth (HOSPITAL_COMMUNITY): Payer: Self-pay | Admitting: *Deleted

## 2011-09-05 NOTE — Telephone Encounter (Signed)
Toniann Fail called today in regards to Mr Ian Williams.  His condition is worsening.  His edema has significantly increased and his legs are weeping, his 02 sats are 89%, he is incontinent and has increased confusion.  Please follow up.  Thanks.

## 2011-09-05 NOTE — Telephone Encounter (Signed)
Spoke with Toniann Fail from Hospice. Continue current plan. SBP >100. Take extra Metolazone 2.5 mg today. Hospice to increase visits to twice a week.

## 2011-09-12 ENCOUNTER — Ambulatory Visit: Payer: Self-pay | Admitting: Cardiovascular Disease

## 2011-09-12 DIAGNOSIS — I4891 Unspecified atrial fibrillation: Secondary | ICD-10-CM

## 2011-09-12 LAB — POCT INR: INR: 3.8

## 2011-09-17 ENCOUNTER — Ambulatory Visit: Payer: Self-pay | Admitting: Internal Medicine

## 2011-09-17 DIAGNOSIS — I4891 Unspecified atrial fibrillation: Secondary | ICD-10-CM

## 2011-09-17 LAB — PROTIME-INR: INR: 5.9 — AB (ref <=1.1)

## 2011-09-17 LAB — POCT INR: INR: 6.4

## 2011-09-19 ENCOUNTER — Telehealth (HOSPITAL_COMMUNITY): Payer: Self-pay | Admitting: *Deleted

## 2011-09-19 NOTE — Telephone Encounter (Signed)
Toniann Fail called today to let us know that Mr Bekker did not take his 2.5 levothyroxine before his lasix today.  His wife held it because his weight is down to 119 from 134.  Please follow up. Thanks.

## 2011-09-19 NOTE — Telephone Encounter (Signed)
Talked to Toniann Fail concerning Mr. Kops dramatic weight loss.  He weights were running 132-134 but have dropped to 118-119.  His metolazone was held today.  He appears very cachectic and is not eating much.  He was recently treated for respiratory infection which improved.  Currently,  he is not short of breath, there is mild lower extremity edema, which is much improved.  Sat on RA 97%.  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  Have instructed Toniann Fail to hold Friday metolazone at this time.  Will reassess need if weight increases or shortness of breath worse.  She voiced understanding.

## 2011-09-22 ENCOUNTER — Ambulatory Visit: Payer: Self-pay | Admitting: Cardiovascular Disease

## 2011-09-22 DIAGNOSIS — I4891 Unspecified atrial fibrillation: Secondary | ICD-10-CM

## 2011-09-22 LAB — PROTIME-INR: INR: 5.9 — AB (ref <=1.1)

## 2011-09-25 ENCOUNTER — Ambulatory Visit: Payer: Self-pay | Admitting: Internal Medicine

## 2011-09-25 ENCOUNTER — Ambulatory Visit (HOSPITAL_COMMUNITY)
Admission: RE | Admit: 2011-09-25 | Discharge: 2011-09-25 | Disposition: A | Discharge status: Home / Self Care | Source: Ambulatory Visit | Attending: Internal Medicine | Admitting: Internal Medicine

## 2011-09-25 VITALS — BP 82/54 | HR 125 | Wt 121.0 lb

## 2011-09-25 DIAGNOSIS — I4891 Unspecified atrial fibrillation: Secondary | ICD-10-CM

## 2011-09-25 DIAGNOSIS — I5022 Chronic systolic (congestive) heart failure: Secondary | ICD-10-CM | POA: Insufficient documentation

## 2011-09-25 MED ORDER — RAMIPRIL 2.5 MG PO CAPS: 2.5000 mg | ORAL_CAPSULE | Freq: Every day | ORAL | Status: AC

## 2011-09-25 MED ORDER — FUROSEMIDE 80 MG PO TABS: 40.0000 mg | ORAL_TABLET | Freq: Every day | ORAL | Status: AC

## 2011-09-25 NOTE — Patient Instructions (Signed)
Please stop taking digoxin, zetia, metolazone, multi-vitamin, niacin, fish oil, spironolactone and coumadin.  Cut lasix back to 40 mg (0.5 tab) daily.  May take extra half tab with increased shortness of breath.  Cut back ramipril to once daily.   Follow up 1 month.  Labs by Toniann Fail today.

## 2011-09-25 NOTE — Assessment & Plan Note (Addendum)
Patient's volume status looks ok.  NYHA IIIb.  His functional capacity continues to decline with hypotension, poor appetite and weight loss.  Discussed poor prognosis with the patient and his wife and son.  Will discontinue metolazone, digoxin, and spironolactone and coumadin.  Will decrease ramipril to daily.  Cut back lasix to 40 mg daily.  With his poor prognosis have also discussed code status.  He has voiced he wanted no CPR, no intubation and no advanced therapies. Discussed this with Toniann Fail at [facility].  With is increased confusion will also check BMET today.     Patient seen and examined with Ulyess Blossom PA-C. We discussed all aspects of the encounter. I agree with the assessment and plan as stated above.  He is very near the end of his life.We discussed this with him and his wife as well as his Hospice nurse Toniann Fail. We have made him a full no code blue and emphasized the importance of comfort care. Will discontinue all non-essential meds. They would like to f/u in clinic on only a PRN basis. They have the clinic number and we instructed them to call with any issues or questions.

## 2011-09-25 NOTE — Progress Notes (Signed)
HPI:  78 year old male ischemic cardiomyopathy, chronic systolic heart failure EF 15% followed by Dr. Antoine Poche and Dr. Darcus Austin.   PMHx notable - AF s/p DC-CV 7/12, CAD s/p CABG, gout and hypothyroid. S/p BiVICD medtronic MI - LV lead turned off due to dyspnea  ICD deactivated 08/11/11  Returns for follow up today.  He feels pretty good.  His appetite has really diminished, usually 1 meal a day.  Weight down 13 pounds.  Weight dropped to 116 pounds from low 130s, metolazone held.  Chronic 2 pillow uses/no PND.  On O2 at night.  Increasing sleep throughout the day.  Hospice following 2x per week.  Restless at night lately with  increased confusion per wife.     ROS: All other systems normal except as mentioned in HPI, past medical history and problem list.    Past Medical History  Diagnosis Date  . Ischemic cardiomyopathy 02/28/00    CABG;8/11 Echo: EF=20-25% with septal akinesia and global hypokinesia and and LV systolic dysfunction.10/08 Echo: EF=35-40% with  LV dysfunction  . Paroxysmal atrial fibrillation     Referred at at time of ICD implantation-now on amiodarone  . Peripheral vascular disease     Iliac disease and mesenteric disease  . Carotid arterial disease     Left Carotid Endartectomy  . Cardiac arrest 02/24/2000  . Hypertension   . Long term current use of anticoagulant   . History of kidney stones   . Hyperlipidemia   . Dual implantable cardiac defibrillator in situ 04/2010    Revision 06/2010 -Medtronic  . Gout   . Diabetes mellitus type II   . DJD (degenerative joint disease)   . BPH (benign prostatic hyperplasia)   . CHF (congestive heart failure)     Current Outpatient Prescriptions  Medication Sig Dispense Refill  . acetaminophen (TYLENOL) 325 MG tablet Take 650 mg by mouth 2 (two) times daily as needed. For pain      . allopurinol (ZYLOPRIM) 300 MG tablet Take 300 mg by mouth daily.        . digoxin (LANOXIN) 0.125 MG tablet Take 62.5 mcg by mouth daily. .        . ezetimibe (ZETIA) 10 MG tablet Take 5 mg by mouth daily.       . famotidine (PEPCID) 20 MG tablet Take 20 mg by mouth daily.        . furosemide (LASIX) 80 MG tablet Take 40 mg by mouth 2 (two) times daily.      . hydroxypropyl methylcellulose (ISOPTO TEARS) 2.5 % ophthalmic solution Place 1 drop into both eyes 2 (two) times daily as needed. Opti fresh eye drops       . levothyroxine (SYNTHROID, LEVOTHROID) 100 MCG tablet Take 100-150 mcg by mouth daily. Take 1 tablet everyday except on Wednesdays and Fridays take 1.5 tablets      . metolazone (ZAROXOLYN) 2.5 MG tablet Take 2.5 mg by mouth every 7 (seven) days. Take 1 tablet every Friday      . metoprolol (LOPRESSOR) 25 MG tablet Take 1 tablet (25 mg total) by mouth 2 (two) times daily.  60 tablet  9  . Multiple Vitamins-Minerals ([employer]S SPECTRAVITE ADVANCED PO) Take 1 tablet by mouth daily.        . Multiple Vitamins-Minerals (OCUVITE ADULT 50+) CAPS Take 1 capsule by mouth 2 (two) times daily.        . niacin (NIASPAN) 500 MG CR tablet Take 1 tablet (500 mg total) by  mouth at bedtime.  90 tablet  6  . Omega-3 Fatty Acids (FISH OIL) 1000 MG CAPS Take 1 capsule by mouth 2 (two) times daily.        . ramipril (ALTACE) 2.5 MG capsule Take 1 capsule (2.5 mg total) by mouth 2 (two) times daily.  60 capsule  3  . spironolactone (ALDACTONE) 25 MG tablet Take 1 tablet (25 mg total) by mouth daily.  30 tablet  6  . warfarin (COUMADIN) 5 MG tablet Take 2.5-5 mg by mouth daily. Take 1 tablet all days except Tuesday take 0.5 tablet      . DISCONTD: metolazone (ZAROXOLYN) 2.5 MG tablet Take 1 tablet (2.5 mg total) by mouth daily.  30 tablet  0  . DISCONTD: spironolactone (ALDACTONE) 25 MG tablet Take 0.5 tablets (12.5 mg total) by mouth daily.  30 tablet  6     Allergies  Allergen Reactions  . Penicillins Other (See Comments)    Unknown reaction  . Sulfonamide Derivatives Other (See Comments)    Unknown reaction    PHYSICAL EXAM: Filed Vitals:    09/25/11 0933  BP: 82/54  Pulse: 125  Weight: 121 lb (54.885 kg)   General:  Frail appearing. No respiratory difficulty wife present HEENT: normal Neck: supple. JVP 9-10   Carotids 2+ bilat; no bruits. No lymphadenopathy or thryomegaly appreciated. Cor: PMI nondisplaced. Iregular rate & rhythm. S3 Lungs: Clear  Abdomen: soft, nontender, nondistended. Liver edge palpable  No bruits or masses. Good bowel sounds. Extremities: no cyanosis, clubbing, rash, trace to 1+ edema, compression stockings in place but loose.  Cold extremities.  Multiple areas of bruising and cuts.   Neuro: alert & oriented x 3, cranial nerves grossly intact. moves all 4 extremities w/o difficulty. Affect pleasant.   ASSESSMENT & PLAN:

## 2011-10-01 ENCOUNTER — Telehealth: Payer: Self-pay | Admitting: Physician Assistant

## 2011-10-02 ENCOUNTER — Telehealth: Payer: Self-pay | Admitting: Internal Medicine

## 2011-10-02 NOTE — Telephone Encounter (Signed)
Doctors Bensimhon and Hochrein on vacation until 10/06/2011. DOD uncomfortable due to patient deceasing at residence.Informed Ferdinand Lango the certificate would not be completed until Monday 10/06/2011.

## 2011-10-09 ENCOUNTER — Encounter (HOSPITAL_COMMUNITY): Payer: Self-pay

## 2011-10-28 ENCOUNTER — Telehealth: Payer: Self-pay | Admitting: Cardiology

## 2011-10-28 ENCOUNTER — Encounter (HOSPITAL_COMMUNITY): Payer: Medicare Other

## 2011-10-28 NOTE — Telephone Encounter (Signed)
Left message for Amy that information was completed and she should be receiving it soon.

## 2011-10-28 NOTE — Telephone Encounter (Signed)
[facility] Hospice was calling to check on orders for this pt

## 2011-10-29 NOTE — Telephone Encounter (Signed)
Cordelia Pen called to notify Dr Gala Romney and Dr Antoine Poche that Ian Williams had died. Will send message.

## 2011-10-29 DEATH — deceased

## 2011-11-13 ENCOUNTER — Encounter

## 2012-06-22 IMAGING — CR DG CHEST 2V
2 series · 2 of 2 positions shown · non-contrast
Comparison: March 05, 2010

CLINICAL DATA: Atrial fibrillation, hypertension, shortness of
breath, cough, diabetes, smoker

CHEST - 2 VIEW

[w chest pa]
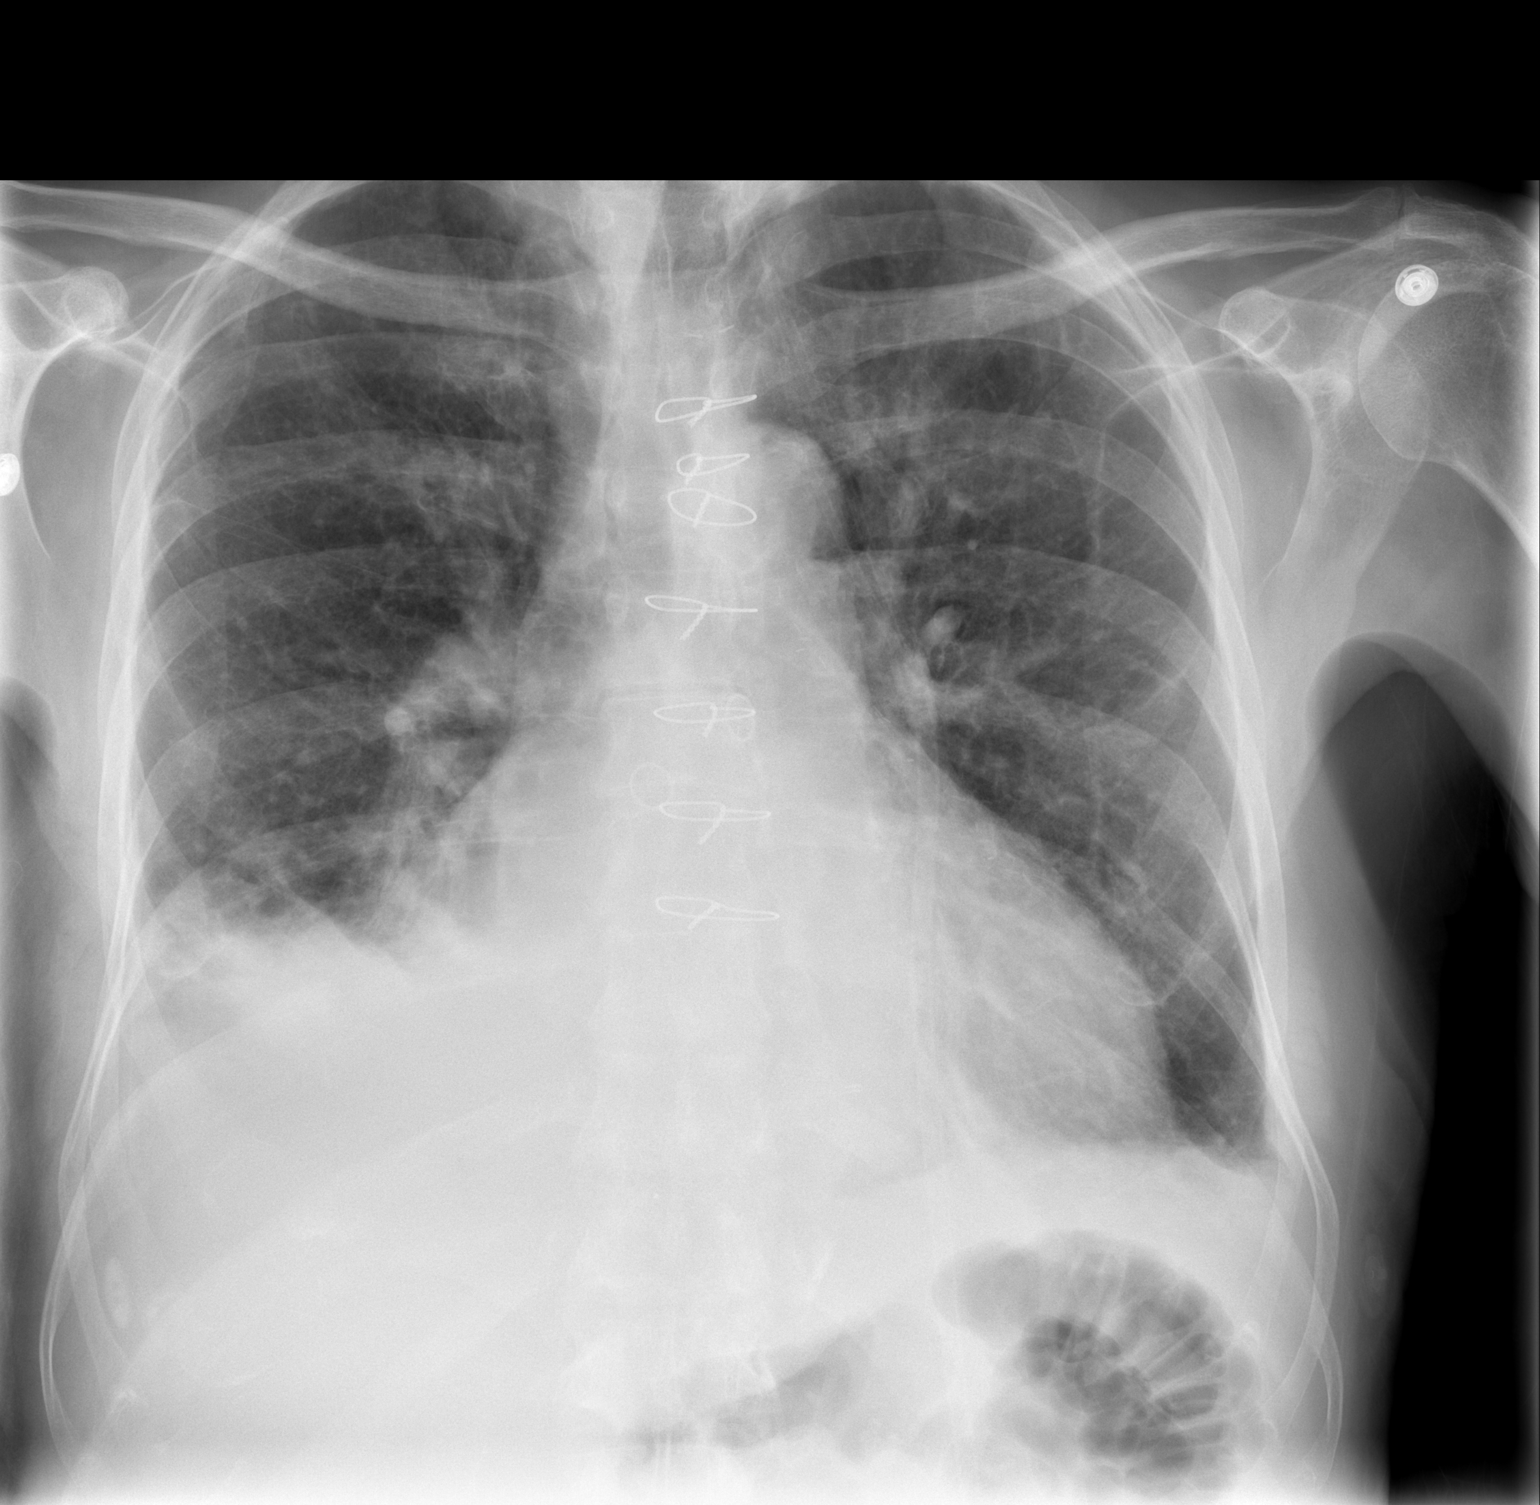

[w chest lat]
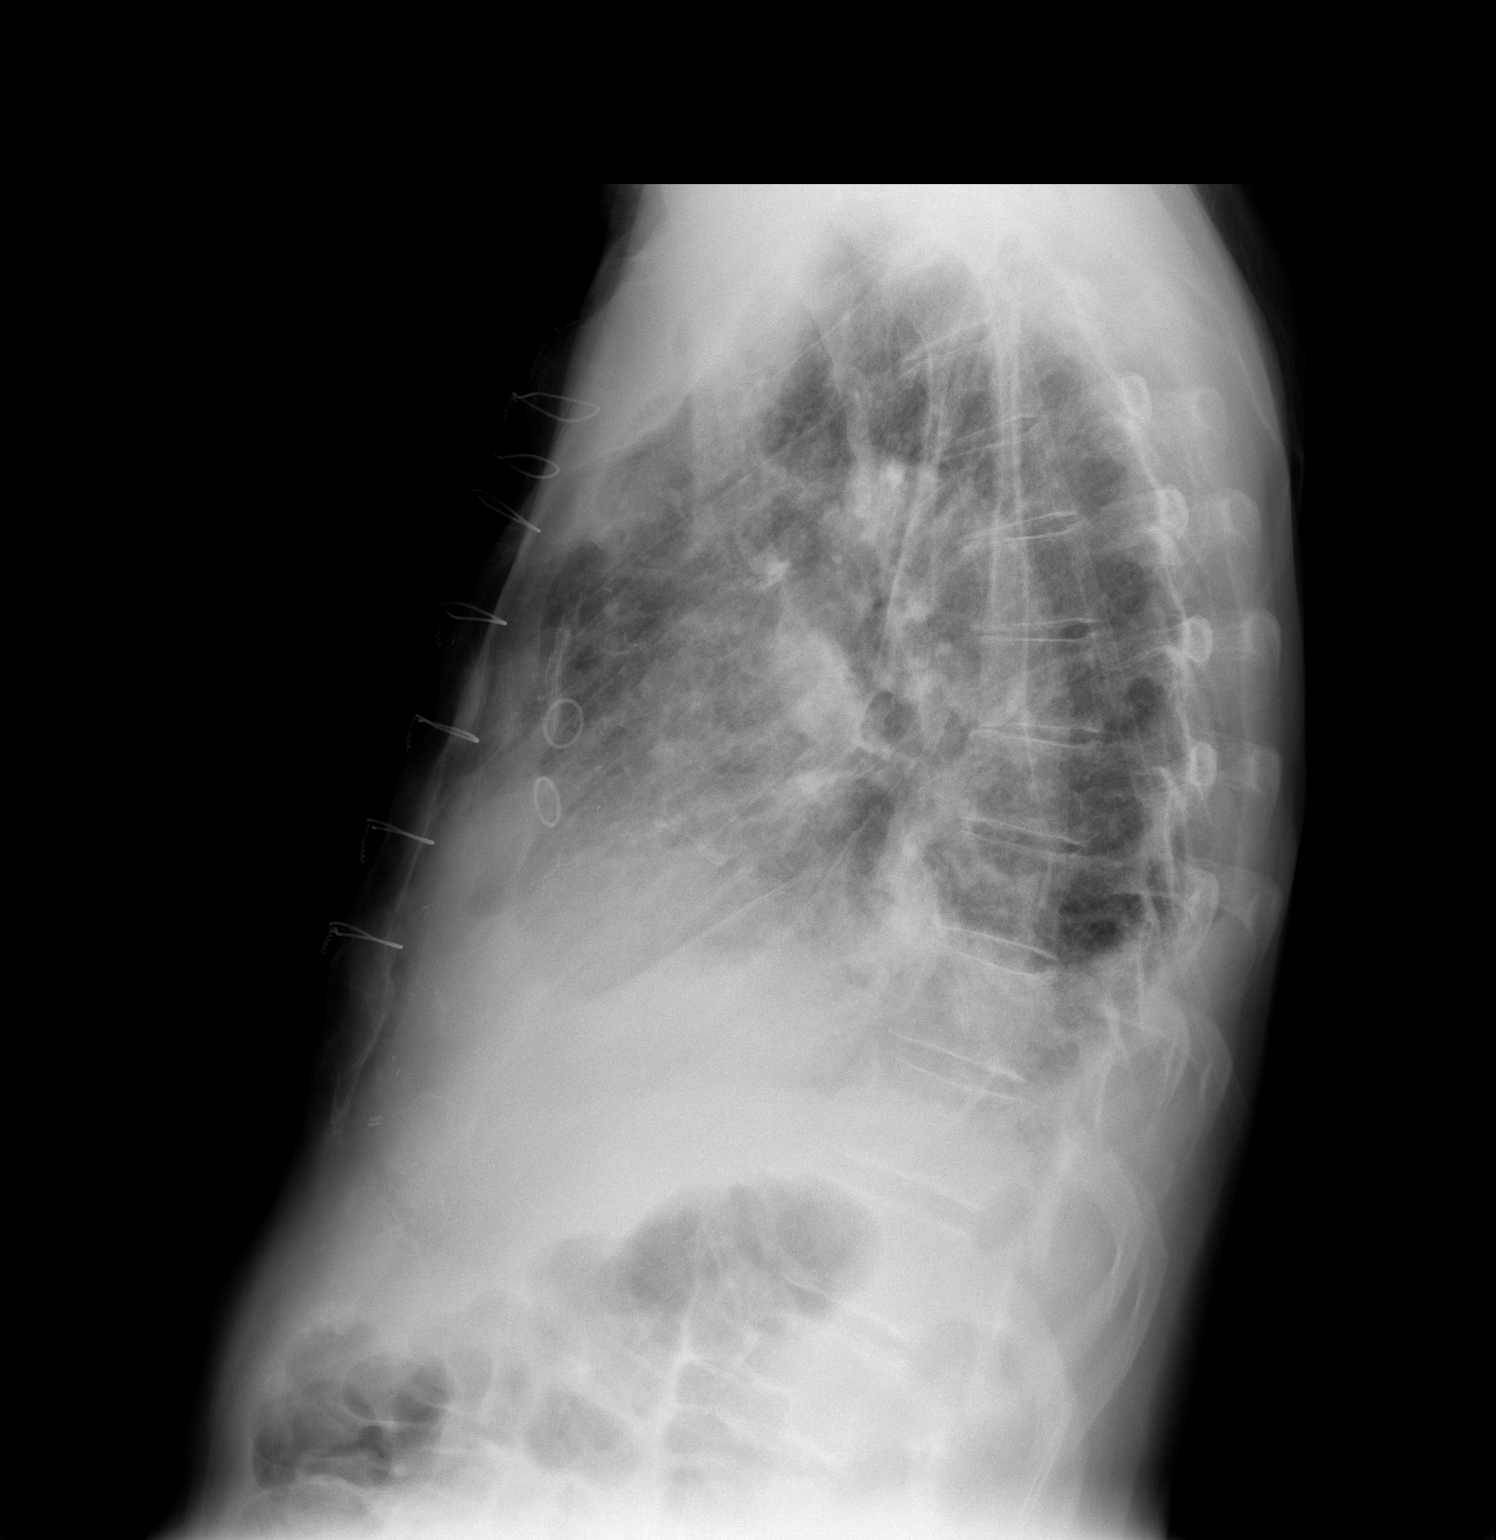

[2 of 2 positions shown; findings below may reference images not displayed]

FINDINGS: Cardiomegaly is present.  Interstitial edema and
bilateral pleural effusions, right greater than left are now
present.  There is also right basilar atelectasis.  Chronic
interstitial fibrotic changes are again noted.
IMPRESSION: Mild interstitial edema with bilateral pleural effusions, right
greater than left.

Cardiomegaly.

Chronic interstitial fibrotic disease.

## 2012-06-30 IMAGING — CR DG CHEST 2V
2 series · 2 of 2 positions shown · non-contrast
Comparison: 05/01/2010

CLINICAL DATA: Left ventricular dysfunction

CHEST - 2 VIEW

[w chest pa]
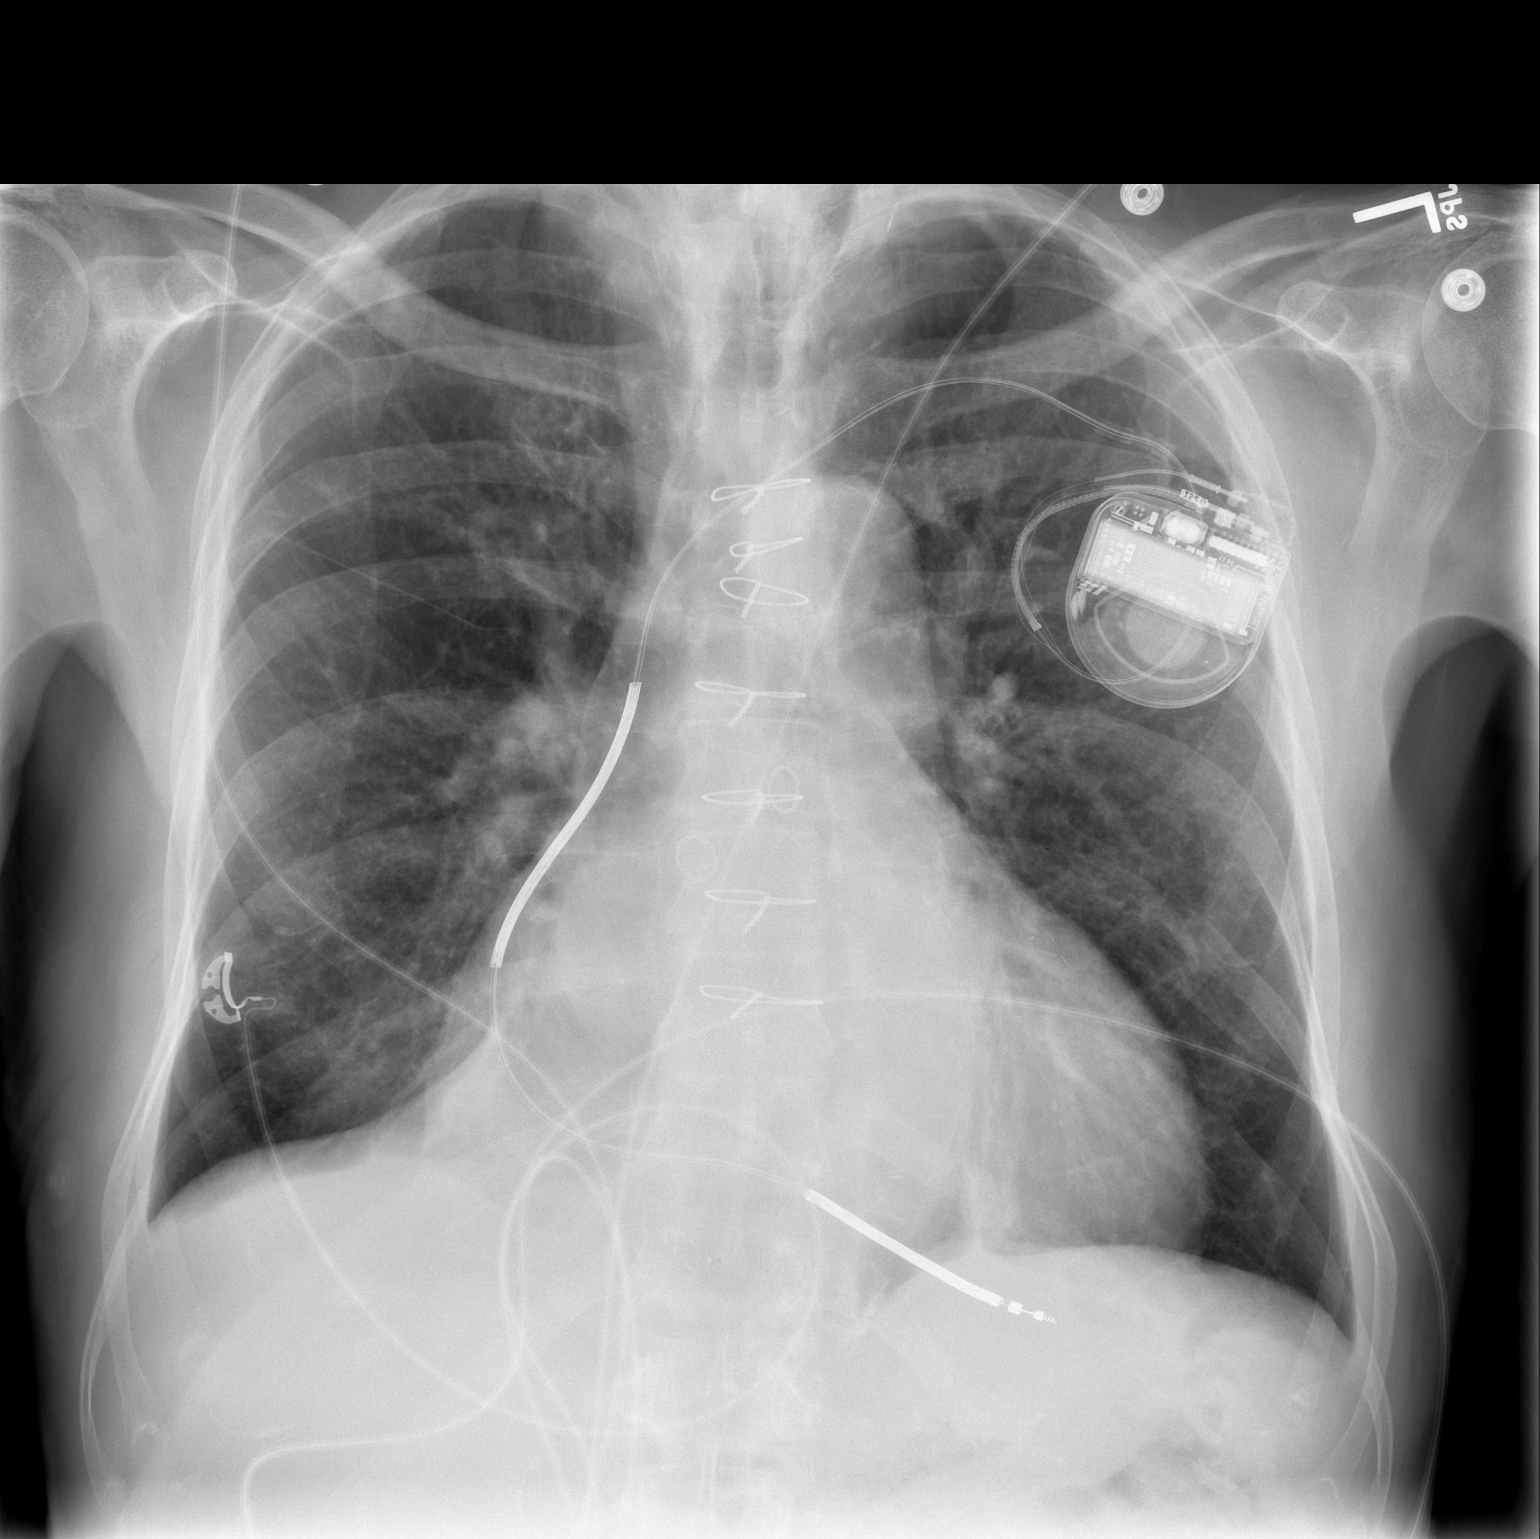

[w chest lat]
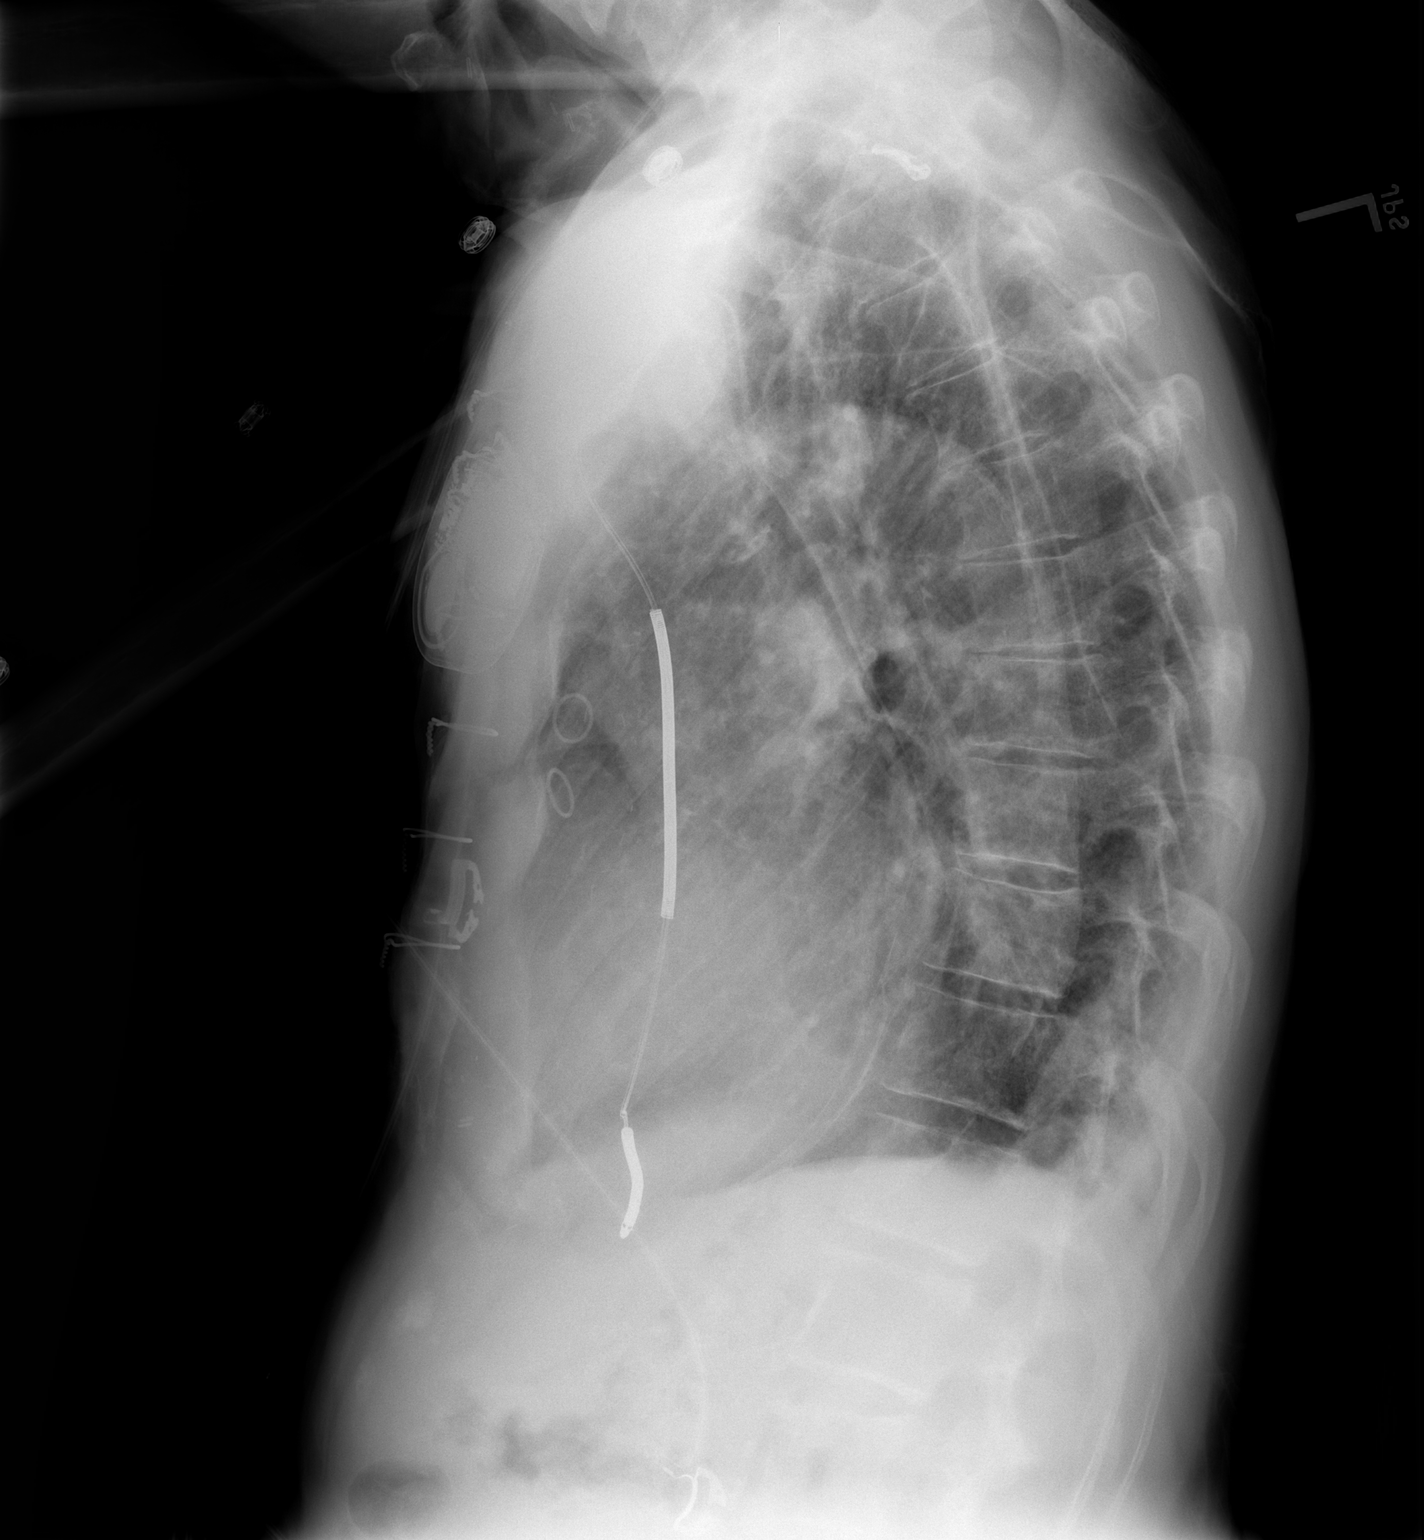

[2 of 2 positions shown; findings below may reference images not displayed]

FINDINGS: There is a left chest wall ICD with lead in the right
ventricle. The heart size appears enlarged.  There has been
interval decrease in pleural effusions and interstitial edema.  No
new findings.
IMPRESSION: 1.  Interval improvement in CHF pattern.

## 2013-06-27 IMAGING — CR DG CHEST 2V
2 series · 2 of 2 positions shown · non-contrast
Comparison: 07/06/2010

CLINICAL DATA: Dyspnea

CHEST - 2 VIEW

[w chest pa]
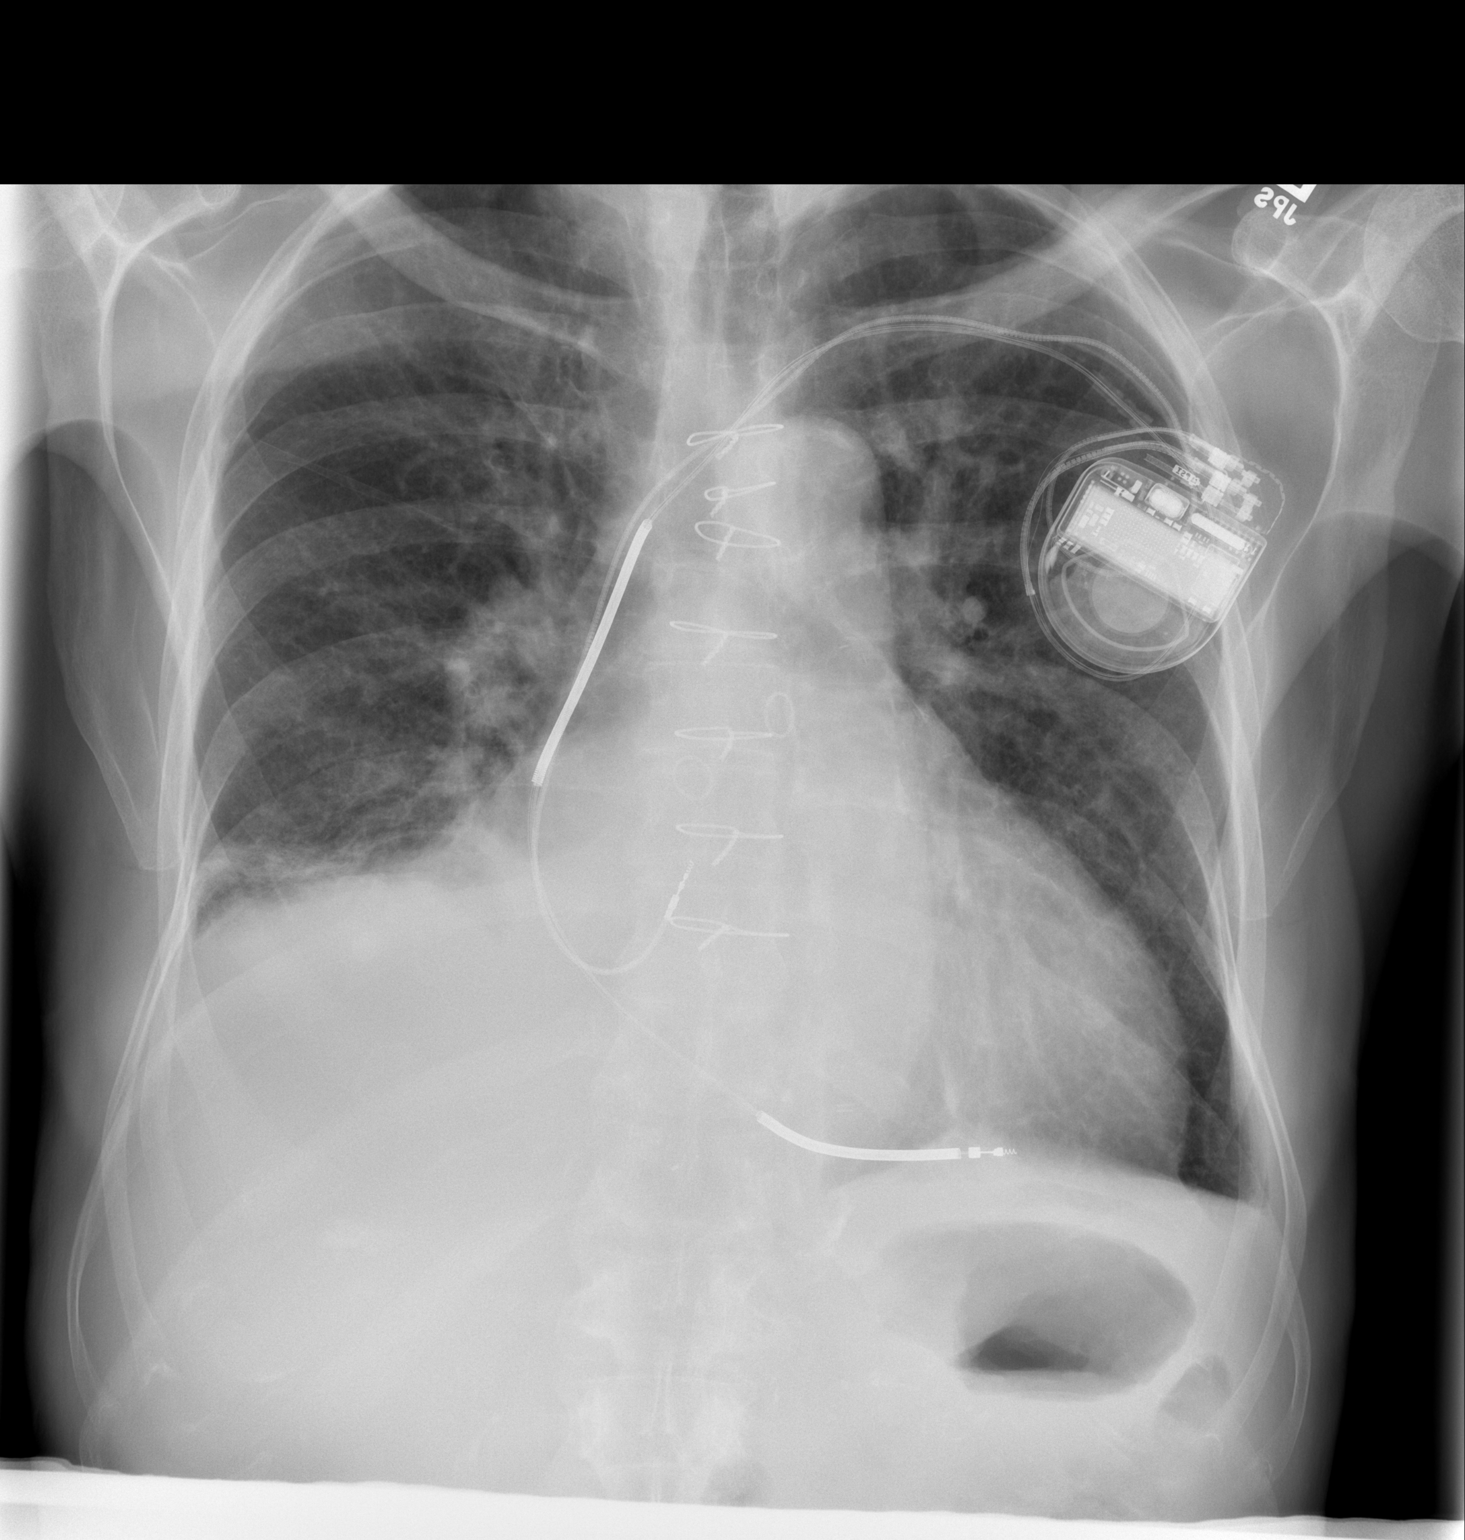

[w chest lat]
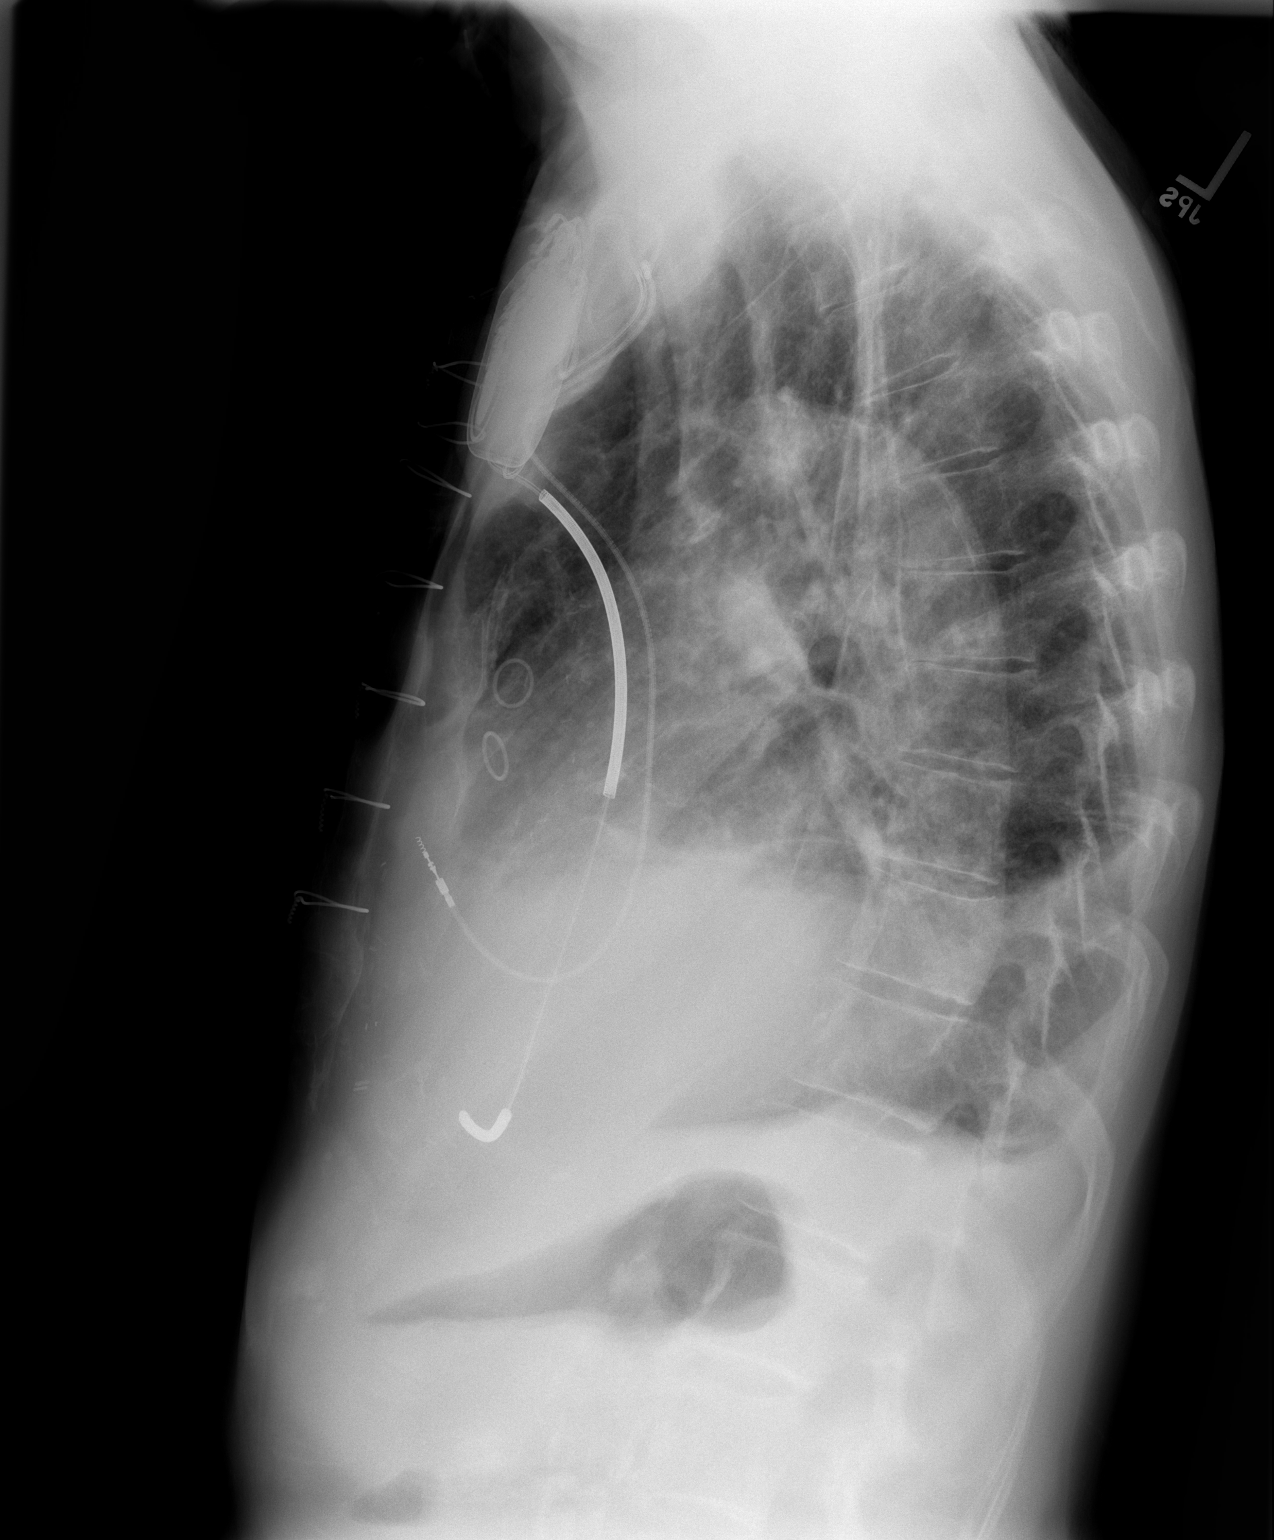

[2 of 2 positions shown; findings below may reference images not displayed]

FINDINGS: Postsurgical changes are again seen.  The implantable
defibrillator is again noted and stable.  The left lung remains
clear.  There is elevation of the right hemidiaphragm and right
basilar infiltrate identified.  Small effusions are noted.
IMPRESSION: Right basilar infiltrate with associated small effusion.  There is
also suggestion of a small left-sided pleural effusion.

## 2014-01-12 NOTE — Telephone Encounter (Signed)
Close encounter
